# Patient Record
Sex: Male | Born: 1945
Health system: Southern US, Community
[De-identification: ages and names within clinical notes are randomized; demographics above are authoritative.]

## PROBLEM LIST (undated history)

## (undated) DIAGNOSIS — R519 Headache, unspecified: Secondary | ICD-10-CM

## (undated) DIAGNOSIS — F32A Depression, unspecified: Secondary | ICD-10-CM

## (undated) DIAGNOSIS — F329 Major depressive disorder, single episode, unspecified: Secondary | ICD-10-CM

## (undated) DIAGNOSIS — J189 Pneumonia, unspecified organism: Secondary | ICD-10-CM

## (undated) DIAGNOSIS — R569 Unspecified convulsions: Secondary | ICD-10-CM

## (undated) DIAGNOSIS — G039 Meningitis, unspecified: Secondary | ICD-10-CM

## (undated) DIAGNOSIS — R55 Syncope and collapse: Secondary | ICD-10-CM

## (undated) DIAGNOSIS — F5104 Psychophysiologic insomnia: Secondary | ICD-10-CM

## (undated) DIAGNOSIS — R51 Headache: Secondary | ICD-10-CM

## (undated) HISTORY — DX: Syncope and collapse: R55

## (undated) HISTORY — DX: Meningitis, unspecified: G03.9

## (undated) HISTORY — DX: Depression, unspecified: F32.A

## (undated) HISTORY — DX: Unspecified convulsions: R56.9

## (undated) HISTORY — DX: Psychophysiologic insomnia: F51.04

## (undated) HISTORY — DX: Major depressive disorder, single episode, unspecified: F32.9

## (undated) HISTORY — PX: OTHER SURGICAL HISTORY: SHX169

---

## 2005-02-09 ENCOUNTER — Ambulatory Visit (HOSPITAL_COMMUNITY): Admission: RE | Admit: 2005-02-09 | Discharge: 2005-02-09 | Payer: Self-pay | Admitting: Gastroenterology

## 2012-11-19 ENCOUNTER — Telehealth: Payer: Self-pay | Admitting: Neurology

## 2012-11-19 NOTE — Telephone Encounter (Signed)
I called and left a message for the patient that his medication list will be up front waiting for pick up.

## 2013-01-29 ENCOUNTER — Encounter: Payer: Self-pay | Admitting: Neurology

## 2013-01-29 DIAGNOSIS — R55 Syncope and collapse: Secondary | ICD-10-CM | POA: Insufficient documentation

## 2013-02-04 ENCOUNTER — Encounter: Payer: Self-pay | Admitting: Neurology

## 2013-02-04 ENCOUNTER — Ambulatory Visit (INDEPENDENT_AMBULATORY_CARE_PROVIDER_SITE_OTHER): Payer: Medicare Other | Admitting: Neurology

## 2013-02-04 VITALS — BP 112/71 | HR 67 | Wt 161.0 lb

## 2013-02-04 DIAGNOSIS — R55 Syncope and collapse: Secondary | ICD-10-CM

## 2013-02-04 MED ORDER — LEVETIRACETAM 750 MG PO TABS: 750.0000 mg | ORAL_TABLET | Freq: Two times a day (BID) | ORAL | Status: DC

## 2013-02-04 NOTE — Patient Instructions (Signed)
Try magnesium oxide tablets 250 mg at night

## 2013-02-04 NOTE — Progress Notes (Signed)
   Reason for visit: Possible seizure  Leon Kennedy is an 67 y.o. male  History of present illness:  Leon Kennedy is a 67 year old left-handed white male with a history of episodes of "lost time". The events are felt to represent seizures, and the patient has done better with the Keppra. The patient is on 750 mg twice daily, and he is no longer driving a car, he indicates that he has not driven in 6 or 7 years. The patient otherwise is functioning well, without any new medical problems. The patient does report some mild issues with memory which has been a problem for several years. The patient is remaining active, and he exercises on a regular basis, and plays golf.  Past Medical History  Diagnosis Date  . Seizures   . Depression   . Meningitis spinal     Past Surgical History  Procedure Laterality Date  . Tonsillectomy      Family History  Problem Relation Age of Onset  . Dementia Mother   . Depression Mother   . Heart attack Father   . Diabetes Father     Social history:  reports that he has never smoked. He has never used smokeless tobacco. He reports that  drinks alcohol. He reports that he does not use illicit drugs.  Allergies:  Allergies  Allergen Reactions  . Sulfa Antibiotics     Medications:  Current Outpatient Prescriptions on File Prior to Visit  Medication Sig Dispense Refill  . aspirin 81 MG tablet Take 81 mg by mouth daily.      . Coenzyme Q10 (CO Q 10) 10 MG CAPS Take 100 tablets by mouth daily.      Marland Kitchen loratadine (CLARITIN) 10 MG tablet Take 10 mg by mouth daily.      . Multiple Vitamin (MULTIVITAMIN) tablet Take 1 tablet by mouth daily.       No current facility-administered medications on file prior to visit.    ROS:  Out of a complete 14 system review of symptoms, the patient complains only of the following symptoms, and all other reviewed systems are negative.  Weight loss Blurred vision Easy bruising Increased thirst Urination  frequency Runny nose Memory loss, insomnia Nocturnal leg cramps  Blood pressure 112/71, pulse 67, weight 161 lb (73.029 kg).  Physical Exam  General: The patient is alert and cooperative at the time of the examination.  Skin: No significant peripheral edema is noted.   Neurologic Exam  Cranial nerves: Facial symmetry is present. Speech is normal, no aphasia or dysarthria is noted. Extraocular movements are full. Visual fields are full.  Motor: The patient has good strength in all 4 extremities.  Coordination: The patient has good finger-nose-finger and heel-to-shin bilaterally.  Gait and station: The patient has a normal gait. Tandem gait is normal. Romberg is negative. No drift is seen.  Reflexes: Deep tendon reflexes are symmetric.   Assessment/Plan:  1. Probable seizure events  2. Nocturnal leg cramps  The patient will begin taking magnesium oxide tablets at night on the nights that he plays golf during the day. The patient will remain on Keppra, and he will followup in one year. The patient doing quite well on the medication.  Marlan Palau MD 02/04/2013 7:44 PM  [facility] [address] [place], [place] [address]  Phone [phone] Fax [phone]

## 2013-02-05 ENCOUNTER — Encounter: Payer: Self-pay | Admitting: Neurology

## 2013-04-25 ENCOUNTER — Other Ambulatory Visit: Payer: Self-pay

## 2014-01-14 ENCOUNTER — Other Ambulatory Visit: Payer: Self-pay

## 2014-01-14 MED ORDER — LEVETIRACETAM 750 MG PO TABS: 750.0000 mg | ORAL_TABLET | Freq: Two times a day (BID) | ORAL | Status: DC

## 2014-01-14 NOTE — Telephone Encounter (Signed)
Patient has appt scheduled in Nov  

## 2014-02-03 ENCOUNTER — Ambulatory Visit: Payer: Medicare Other | Admitting: Neurology

## 2014-03-27 ENCOUNTER — Ambulatory Visit: Payer: Medicare Other | Admitting: Neurology

## 2014-03-31 ENCOUNTER — Ambulatory Visit
Admission: RE | Admit: 2014-03-31 | Discharge: 2014-03-31 | Disposition: A | Discharge status: Home / Self Care | Payer: Medicare Other | Source: Ambulatory Visit | Attending: Nurse Practitioner | Admitting: Nurse Practitioner

## 2014-03-31 ENCOUNTER — Other Ambulatory Visit: Payer: Self-pay | Admitting: Nurse Practitioner

## 2014-03-31 DIAGNOSIS — R059 Cough, unspecified: Secondary | ICD-10-CM

## 2014-03-31 DIAGNOSIS — R05 Cough: Secondary | ICD-10-CM

## 2014-03-31 DIAGNOSIS — J189 Pneumonia, unspecified organism: Secondary | ICD-10-CM

## 2014-03-31 HISTORY — DX: Pneumonia, unspecified organism: J18.9

## 2014-05-05 ENCOUNTER — Encounter: Payer: Self-pay | Admitting: Neurology

## 2014-05-05 ENCOUNTER — Ambulatory Visit (INDEPENDENT_AMBULATORY_CARE_PROVIDER_SITE_OTHER): Payer: Medicare Other | Admitting: Neurology

## 2014-05-05 VITALS — BP 122/80 | HR 80 | Ht 68.0 in | Wt 164.4 lb

## 2014-05-05 DIAGNOSIS — R55 Syncope and collapse: Secondary | ICD-10-CM

## 2014-05-05 HISTORY — DX: Syncope and collapse: R55

## 2014-05-05 MED ORDER — LEVETIRACETAM 750 MG PO TABS: 750.0000 mg | ORAL_TABLET | Freq: Two times a day (BID) | ORAL | Status: DC

## 2014-05-05 MED ORDER — MELATONIN 10 MG PO TABS: 10.0000 mg | ORAL_TABLET | Freq: Every day | ORAL | Status: DC

## 2014-05-05 NOTE — Progress Notes (Signed)
Reason for visit: Possible seizures  Leon Kennedy is an 68 y.o. male  History of present illness:  [name] is a 68 year old left-handed white male with a history of episodes of "spaciness" that are felt to be possible seizure events. The patient is on Keppra. The patient has very occasional events at this time, possibly 1 or 2 since he was seen over a year ago. The patient has recently been treated for an episode of pneumonia. Otherwise, he is doing relatively well. He has chronic insomnia, he is taking Benadryl and melatonin at night. He drinks a lot of water during the day, and he has frequency of urination associated with this. He occasionally will have some muscle cramps in the calf muscles. He returns to this office for an evaluation.   Past Medical History  Diagnosis Date  . Seizures   . Depression   . Meningitis spinal   . Near syncope 05/05/2014    Past Surgical History  Procedure Laterality Date  . Tonsillectomy      Family History  Problem Relation Age of Onset  . Dementia Mother   . Depression Mother   . Heart attack Father   . Diabetes Father     Social history:  reports that he has never smoked. He has never used smokeless tobacco. He reports that he drinks alcohol. He reports that he does not use illicit drugs.    Allergies  Allergen Reactions  . Sulfa Antibiotics     Medications:  Current Outpatient Prescriptions on File Prior to Visit  Medication Sig Dispense Refill  . aspirin 81 MG tablet Take 81 mg by mouth daily.    . Coenzyme Q10 (CO Q 10) 10 MG CAPS Take 100 tablets by mouth daily.    Marland Kitchen. docusate sodium (COLACE) 100 MG capsule Take 100 mg by mouth 2 (two) times daily.    . Multiple Vitamin (MULTIVITAMIN) tablet Take 1 tablet by mouth daily.    . Red Yeast Rice 600 MG CAPS Take 600 capsules by mouth daily.    . diphenhydrAMINE (BENADRYL) 50 MG capsule Take 50 mg by mouth daily.     No current facility-administered medications on file  prior to visit.    ROS:  Out of a complete 14 system review of symptoms, the patient complains only of the following symptoms, and all other reviewed systems are negative.  Runny nose Choking on food Insomnia, snoring Frequency of urination, urinary urgency Muscle cramps Bleeding easily Memory loss, seizures Agitation  Blood pressure 122/80, pulse 80, height 5\' 8"  (1.727 m), weight 164 lb 6.4 oz (74.571 kg).  Physical Exam  General: The patient is alert and cooperative at the time of the examination.  Skin: No significant peripheral edema is noted.   Neurologic Exam  Mental status: The patient is oriented x 3.  Cranial nerves: Facial symmetry is present. Speech is normal, no aphasia or dysarthria is noted. Extraocular movements are full. Visual fields are full.  Motor: The patient has good strength in all 4 extremities.  Sensory examination: Soft touch sensation is symmetric on the face, arms, and legs.  Coordination: The patient has good finger-nose-finger and heel-to-shin bilaterally.  Gait and station: The patient has a normal gait. Tandem gait is normal. Romberg is negative. No drift is seen.  Reflexes: Deep tendon reflexes are symmetric.   Assessment/Plan:  1. Possible seizure events  The patient doing relatively well since last seen, he will follow-up in about one year. We  will renew the Keppra this time. He will call if issues arise.  Marlan Palau. Keith Willis MD 05/05/2014 7:32 PM  [facility] [address] [place], [place] [id number]  Phone [phone] Fax [phone]

## 2015-03-04 ENCOUNTER — Other Ambulatory Visit: Payer: Self-pay | Admitting: Gastroenterology

## 2015-03-05 ENCOUNTER — Encounter (HOSPITAL_COMMUNITY): Payer: Self-pay | Admitting: *Deleted

## 2015-03-09 ENCOUNTER — Encounter (HOSPITAL_COMMUNITY): Payer: Self-pay | Admitting: *Deleted

## 2015-03-09 ENCOUNTER — Ambulatory Visit (HOSPITAL_COMMUNITY): Payer: Medicare Other | Admitting: Certified Registered Nurse Anesthetist

## 2015-03-09 ENCOUNTER — Ambulatory Visit (HOSPITAL_COMMUNITY)
Admission: RE | Admit: 2015-03-09 | Discharge: 2015-03-09 | Disposition: A | Discharge status: Home / Self Care | Payer: Medicare Other | Source: Ambulatory Visit | Attending: Gastroenterology | Admitting: Gastroenterology

## 2015-03-09 ENCOUNTER — Encounter (HOSPITAL_COMMUNITY)
Admission: RE | Disposition: A | Discharge status: Home / Self Care | Payer: Self-pay | Source: Ambulatory Visit | Attending: Gastroenterology

## 2015-03-09 DIAGNOSIS — Z87891 Personal history of nicotine dependence: Secondary | ICD-10-CM | POA: Insufficient documentation

## 2015-03-09 DIAGNOSIS — Z1211 Encounter for screening for malignant neoplasm of colon: Secondary | ICD-10-CM | POA: Diagnosis not present

## 2015-03-09 DIAGNOSIS — N400 Enlarged prostate without lower urinary tract symptoms: Secondary | ICD-10-CM | POA: Insufficient documentation

## 2015-03-09 DIAGNOSIS — E780 Pure hypercholesterolemia: Secondary | ICD-10-CM | POA: Diagnosis not present

## 2015-03-09 DIAGNOSIS — G40909 Epilepsy, unspecified, not intractable, without status epilepticus: Secondary | ICD-10-CM | POA: Insufficient documentation

## 2015-03-09 HISTORY — DX: Headache, unspecified: R51.9

## 2015-03-09 HISTORY — PX: COLONOSCOPY WITH PROPOFOL: SHX5780

## 2015-03-09 HISTORY — DX: Pneumonia, unspecified organism: J18.9

## 2015-03-09 HISTORY — DX: Headache: R51

## 2015-03-09 SURGERY — COLONOSCOPY WITH PROPOFOL
Anesthesia: Monitor Anesthesia Care

## 2015-03-09 MED ORDER — LACTATED RINGERS IV SOLN: INTRAVENOUS | Status: DC

## 2015-03-09 MED ORDER — PROPOFOL 10 MG/ML IV BOLUS
INTRAVENOUS | Status: DC | PRN
  Administered 2015-03-09: 20 mg via INTRAVENOUS
  Administered 2015-03-09: 30 mg via INTRAVENOUS
  Administered 2015-03-09: 100 mg via INTRAVENOUS
  Administered 2015-03-09: 30 mg via INTRAVENOUS
  Administered 2015-03-09: 20 mg via INTRAVENOUS

## 2015-03-09 MED ORDER — PROPOFOL 10 MG/ML IV BOLUS
INTRAVENOUS | Status: AC
  Filled 2015-03-09: qty 20

## 2015-03-09 MED ORDER — FENTANYL CITRATE (PF) 100 MCG/2ML IJ SOLN: 25.0000 ug | INTRAMUSCULAR | Status: DC | PRN

## 2015-03-09 MED ORDER — LACTATED RINGERS IV SOLN
INTRAVENOUS | Status: DC
  Administered 2015-03-09: 1000 mL via INTRAVENOUS

## 2015-03-09 MED ORDER — SODIUM CHLORIDE 0.9 % IV SOLN: INTRAVENOUS | Status: DC

## 2015-03-09 MED ORDER — GLYCOPYRROLATE 0.2 MG/ML IJ SOLN
INTRAMUSCULAR | Status: AC
  Filled 2015-03-09: qty 1

## 2015-03-09 SURGICAL SUPPLY — 21 items

## 2015-03-09 NOTE — Anesthesia Postprocedure Evaluation (Signed)
  Anesthesia Post-op Note  Patient: Leon Kennedy  Procedure(s) Performed: Procedure(s) (LRB): COLONOSCOPY WITH PROPOFOL (N/A)  Patient Location: PACU  Anesthesia Type: MAC  Level of Consciousness: awake and alert   Airway and Oxygen Therapy: Patient Spontanous Breathing  Post-op Pain: mild  Post-op Assessment: Post-op Vital signs reviewed, Patient's Cardiovascular Status Stable, Respiratory Function Stable, Patent Airway and No signs of Nausea or vomiting  Last Vitals:  Filed Vitals:   03/09/15 1209  BP: 136/73  Pulse: 76  Temp:   Resp: 16    Post-op Vital Signs: stable   Complications: No apparent anesthesia complications

## 2015-03-09 NOTE — Transfer of Care (Signed)
Immediate Anesthesia Transfer of Care Note  Patient: Leon Kennedy [facility]  Procedure(s) Performed: Procedure(s): COLONOSCOPY WITH PROPOFOL (N/A)  Patient Location: PACU and Endoscopy Unit  Anesthesia Type:MAC  Level of Consciousness: awake, alert , oriented and patient cooperative  Airway & Oxygen Therapy: Patient Spontanous Breathing and Patient connected to face mask oxygen  Post-op Assessment: Report given to RN and Post -op Vital signs reviewed and stable  Post vital signs: Reviewed and stable  Last Vitals:  Filed Vitals:   03/09/15 1049  BP: 126/72  Pulse: 73  Temp: 37 C  Resp: 10    Complications: No apparent anesthesia complications

## 2015-03-09 NOTE — H&P (Signed)
  Procedure: Screening colonoscopy. Normal screening colonoscopy performed on 02/09/2005  History: The patient is a 69 year old male born 07-28-1945. He is scheduled to undergo a screening colonoscopy today.  Past medical history: Tonsillectomy. Seizure disorder. Benign prostatic hypertrophy. Spinal meningitis diagnosed at age 49. Herpes zoster left buttocks diagnosed in 2010. Nephrolithiasis. Plantar warts. Left shoulder tendinitis. Hypercholesterolemia.  Medication allergies: Sulfa drugs  Exam: Patient is alert and lying comfortably on endoscopy stretcher. Abdomen is soft and nontender to palpation. Lungs are clear to auscultation. Cardiac exam reveals a regular rhythm.  Plan: Proceed with screening colonoscopy

## 2015-03-09 NOTE — Op Note (Signed)
Procedure: Screening colonoscopy. Normal screening colonoscopy performed on 02/09/2005  Endoscopist: Danise Edge  Premedication: Propofol administered by anesthesia  Procedure: The patient was placed in the left lateral decubitus position. Anal inspection and digital rectal exam were normal. The Pentax pediatric colonoscope was introduced into the rectum and advanced to the cecum. A normal-appearing ileocecal valve and appendiceal orifice were identified. Colonic preparation for the exam today was good. Withdrawal time was 7 minutes  Rectum. Normal. Retroflex view of the distal rectum was normal  Sigmoid colon and descending colon. Normal  Splenic flexure. Normal  Transverse colon. Normal  Hepatic flexure. Normal  Ascending colon. Normal  Cecum and ileocecal valve. Normal.  Assessment: Normal screening colonoscopy

## 2015-03-09 NOTE — Discharge Instructions (Signed)
Colonoscopy, Care After °These instructions give you information on caring for yourself after your procedure. Your doctor may also give you more specific instructions. Call your doctor if you have any problems or questions after your procedure. °HOME CARE °· Do not drive for 24 hours. °· Do not sign important papers or use machinery for 24 hours. °· You may shower. °· You may go back to your usual activities, but go slower for the first 24 hours. °· Take rest breaks often during the first 24 hours. °· Walk around or use warm packs on your belly (abdomen) if you have belly cramping or gas. °· Drink enough fluids to keep your pee (urine) clear or pale yellow. °· Resume your normal diet. Avoid heavy or fried foods. °· Avoid drinking alcohol for 24 hours or as told by your doctor. °· Only take medicines as told by your doctor. °If a tissue sample (biopsy) was taken during the procedure:  °· Do not take aspirin or blood thinners for 7 days, or as told by your doctor. °· Do not drink alcohol for 7 days, or as told by your doctor. °· Eat soft foods for the first 24 hours. °GET HELP IF: °You still have a small amount of blood in your poop (stool) 2-3 days after the procedure. °GET HELP RIGHT AWAY IF: °· You have more than a small amount of blood in your poop. °· You see clumps of tissue (blood clots) in your poop. °· Your belly is puffy (swollen). °· You feel sick to your stomach (nauseous) or throw up (vomit). °· You have a fever. °· You have belly pain that gets worse and medicine does not help. °MAKE SURE YOU: °· Understand these instructions. °· Will watch your condition. °· Will get help right away if you are not doing well or get worse. °Document Released: 07/09/2010 Document Revised: 06/11/2013 Document Reviewed: 02/11/2013 °ExitCare® Patient Information ©2015 [employer]. This information is not intended to replace advice given to you by your health care provider. Make sure you discuss any questions you have with  your health care provider. ° °

## 2015-03-09 NOTE — Anesthesia Preprocedure Evaluation (Addendum)
Anesthesia Evaluation  Patient identified by MRN, date of birth, ID band Patient awake    Reviewed: Allergy & Precautions, H&P , NPO status , Patient's Chart, lab work & pertinent test results  Airway Mallampati: II  TM Distance: >3 FB Neck ROM: full    Dental  (+) Dental Advisory Given, Caps All front upper are capped:   Pulmonary neg pulmonary ROS, former smoker,    Pulmonary exam normal breath sounds clear to auscultation       Cardiovascular Exercise Tolerance: Good negative cardio ROS Normal cardiovascular exam Rhythm:regular Rate:Normal     Neuro/Psych  Headaches, Seizures -, Well Controlled,  Last seizure 4 - 5 years ago negative psych ROS   GI/Hepatic negative GI ROS, Neg liver ROS,   Endo/Other  negative endocrine ROS  Renal/GU negative Renal ROS  negative genitourinary   Musculoskeletal   Abdominal   Peds  Hematology negative hematology ROS (+)   Anesthesia Other Findings   Reproductive/Obstetrics negative OB ROS                            Anesthesia Physical Anesthesia Plan  ASA: II  Anesthesia Plan: MAC   Post-op Pain Management:    Induction:   Airway Management Planned:   Additional Equipment:   Intra-op Plan:   Post-operative Plan:   Informed Consent: I have reviewed the patients History and Physical, chart, labs and discussed the procedure including the risks, benefits and alternatives for the proposed anesthesia with the patient or authorized representative who has indicated his/her understanding and acceptance.   Dental Advisory Given  Plan Discussed with: CRNA and Surgeon  Anesthesia Plan Comments:         Anesthesia Quick Evaluation

## 2015-03-10 ENCOUNTER — Encounter (HOSPITAL_COMMUNITY): Payer: Self-pay | Admitting: Gastroenterology

## 2015-05-04 ENCOUNTER — Ambulatory Visit (INDEPENDENT_AMBULATORY_CARE_PROVIDER_SITE_OTHER): Payer: Medicare Other | Admitting: Neurology

## 2015-05-04 ENCOUNTER — Encounter: Payer: Self-pay | Admitting: Neurology

## 2015-05-04 VITALS — BP 113/70 | HR 69 | Ht 68.0 in | Wt 167.5 lb

## 2015-05-04 DIAGNOSIS — R55 Syncope and collapse: Secondary | ICD-10-CM | POA: Diagnosis not present

## 2015-05-04 MED ORDER — LEVETIRACETAM 750 MG PO TABS: 750.0000 mg | ORAL_TABLET | Freq: Two times a day (BID) | ORAL | Status: DC

## 2015-05-04 NOTE — Progress Notes (Signed)
Reason for visit: Altered awareness  Leon Kennedy is an 69 y.o. male  History of present illness:  [name] is a 69 year old left-handed white male with a history of episodes of transient loss of awareness that have been occurring with some regularity. He is probably had 15-20 episodes over the last year. The patient will have a brief staring episode where he has trouble concentrating. The episodes only last a few moments, and then fully resolve. The patient does not operate a motor vehicle, he has not driven in greater than 8 years. The patient remains active, playing golf on a regular basis. He has had pneumonia in March 2016, he is fully recovered from this. He is tolerating the Keppra fairly well, sleeping well at night. He returns for an evaluation.  Past Medical History  Diagnosis Date  . Meningitis spinal age 70 months  . Near syncope 05/05/2014    fainting spells, none in years  . Depression age mid 6640's and early 5960's  . Headache     sinus  . Seizures (HCC) last 4  to 5 years ago  . Pneumonia 03-31-14    lungs clear since    Past Surgical History  Procedure Laterality Date  . Tonsillectomy  age 89 or 6  . Colonoscopy with propofol N/A 03/09/2015    Procedure: COLONOSCOPY WITH PROPOFOL;  Surgeon: Charolett BumpersMartin K Johnson, MD;  Location: [facility] END[facility]COPY;  Service: Endoscopy;  Laterality: N/A;    Family History  Problem Relation Age of Onset  . Dementia Mother   . Depression Mother   . Heart attack Father   . Diabetes Father     Social history:  reports that he has quit smoking. His smoking use included Cigarettes. He has a 20 pack-year smoking history. He has never used smokeless tobacco. He reports that he drinks alcohol. He reports that he does not use illicit drugs.    Allergies  Allergen Reactions  . Sulfa Antibiotics Anaphylaxis    Childhood reaction    Medications:  Prior to Admission medications   Medication Sig Start Date End Date Taking? Authorizing  Provider  aspirin 81 MG tablet Take 81 mg by mouth 3 (three) times a week.    Yes Historical Provider, MD  Coenzyme Q10 (CO Q 10) 10 MG CAPS Take 100 mg by mouth daily.    Yes Historical Provider, MD  diphenhydrAMINE (BENADRYL) 25 MG tablet Take 25 mg by mouth at bedtime.   Yes Historical Provider, MD  docusate sodium (COLACE) 100 MG capsule Take 100 mg by mouth daily.    Yes Historical Provider, MD  Echinacea 400 MG CAPS Take 500 mg by mouth daily as needed ("feeling sick").    Yes Historical Provider, MD  levETIRAcetam (KEPPRA) 750 MG tablet Take 1 tablet (750 mg total) by mouth every 12 (twelve) hours. 05/05/14  Yes York Spanielharles K Wilhelm Ganaway, MD  Melatonin 5 MG CAPS Take 5 mg by mouth at bedtime.   Yes Historical Provider, MD  Multiple Vitamin (MULTIVITAMIN) tablet Take 1 tablet by mouth daily.   Yes Historical Provider, MD    ROS:  Out of a complete 14 system review of symptoms, the patient complains only of the following symptoms, and all other reviewed systems are negative.  Transient confusion episodes  Blood pressure 113/70, pulse 69, height 5\' 8"  (1.727 m), weight 167 lb 8 oz (75.978 kg).  Physical Exam  General: The patient is alert and cooperative at the time of the examination.  Skin: No significant peripheral edema is noted.   Neurologic Exam  Mental status: The patient is alert and oriented x 3 at the time of the examination. The patient has apparent normal recent and remote memory, with an apparently normal attention span and concentration ability.   Cranial nerves: Facial symmetry is present. Speech is normal, no aphasia or dysarthria is noted. Extraocular movements are full. Visual fields are full.  Motor: The patient has good strength in all 4 extremities.  Sensory examination: Soft touch sensation is symmetric on the face, arms, and legs.  Coordination: The patient has good finger-nose-finger and heel-to-shin bilaterally.  Gait and station: The patient has a normal  gait. Tandem gait is normal. Romberg is negative. No drift is seen.  Reflexes: Deep tendon reflexes are symmetric.   Assessment/Plan:  1. Transient altered awareness  The patient is felt to have brief seizure-type episodes, the Keppra has not fully controlled these events. The patient does not operate a motor vehicle. He will follow-up in one year, a prescription was given for the Keppra.  Marlan Palau MD 05/04/2015 7:54 PM  [facility] [address] [place], [place] [address]  Phone [phone] Fax [phone]

## 2015-07-24 ENCOUNTER — Telehealth: Payer: Self-pay | Admitting: Neurology

## 2015-07-24 MED ORDER — LEVETIRACETAM 750 MG PO TABS: 750.0000 mg | ORAL_TABLET | Freq: Two times a day (BID) | ORAL | Status: DC

## 2015-07-24 NOTE — Telephone Encounter (Signed)
Patient called to advise as of 07/22/15 prescriptions will go to [facility] vs. [facility], Rx for levETIRAcetam (KEPPRA) 750 MG tablet needs to be sent to [facility] Fax# [phone].

## 2015-08-10 DIAGNOSIS — E7800 Pure hypercholesterolemia, unspecified: Secondary | ICD-10-CM | POA: Diagnosis not present

## 2015-08-10 DIAGNOSIS — Z1389 Encounter for screening for other disorder: Secondary | ICD-10-CM | POA: Diagnosis not present

## 2015-08-10 DIAGNOSIS — Z0000 Encounter for general adult medical examination without abnormal findings: Secondary | ICD-10-CM | POA: Diagnosis not present

## 2015-08-19 IMAGING — CR DG CHEST 2V
2 series · 2 of 2 positions shown · non-contrast
Comparison: None.

CLINICAL DATA: Cough for 1 week and fever ; congestion

EXAM:
CHEST  2 VIEW

[w chest pa]
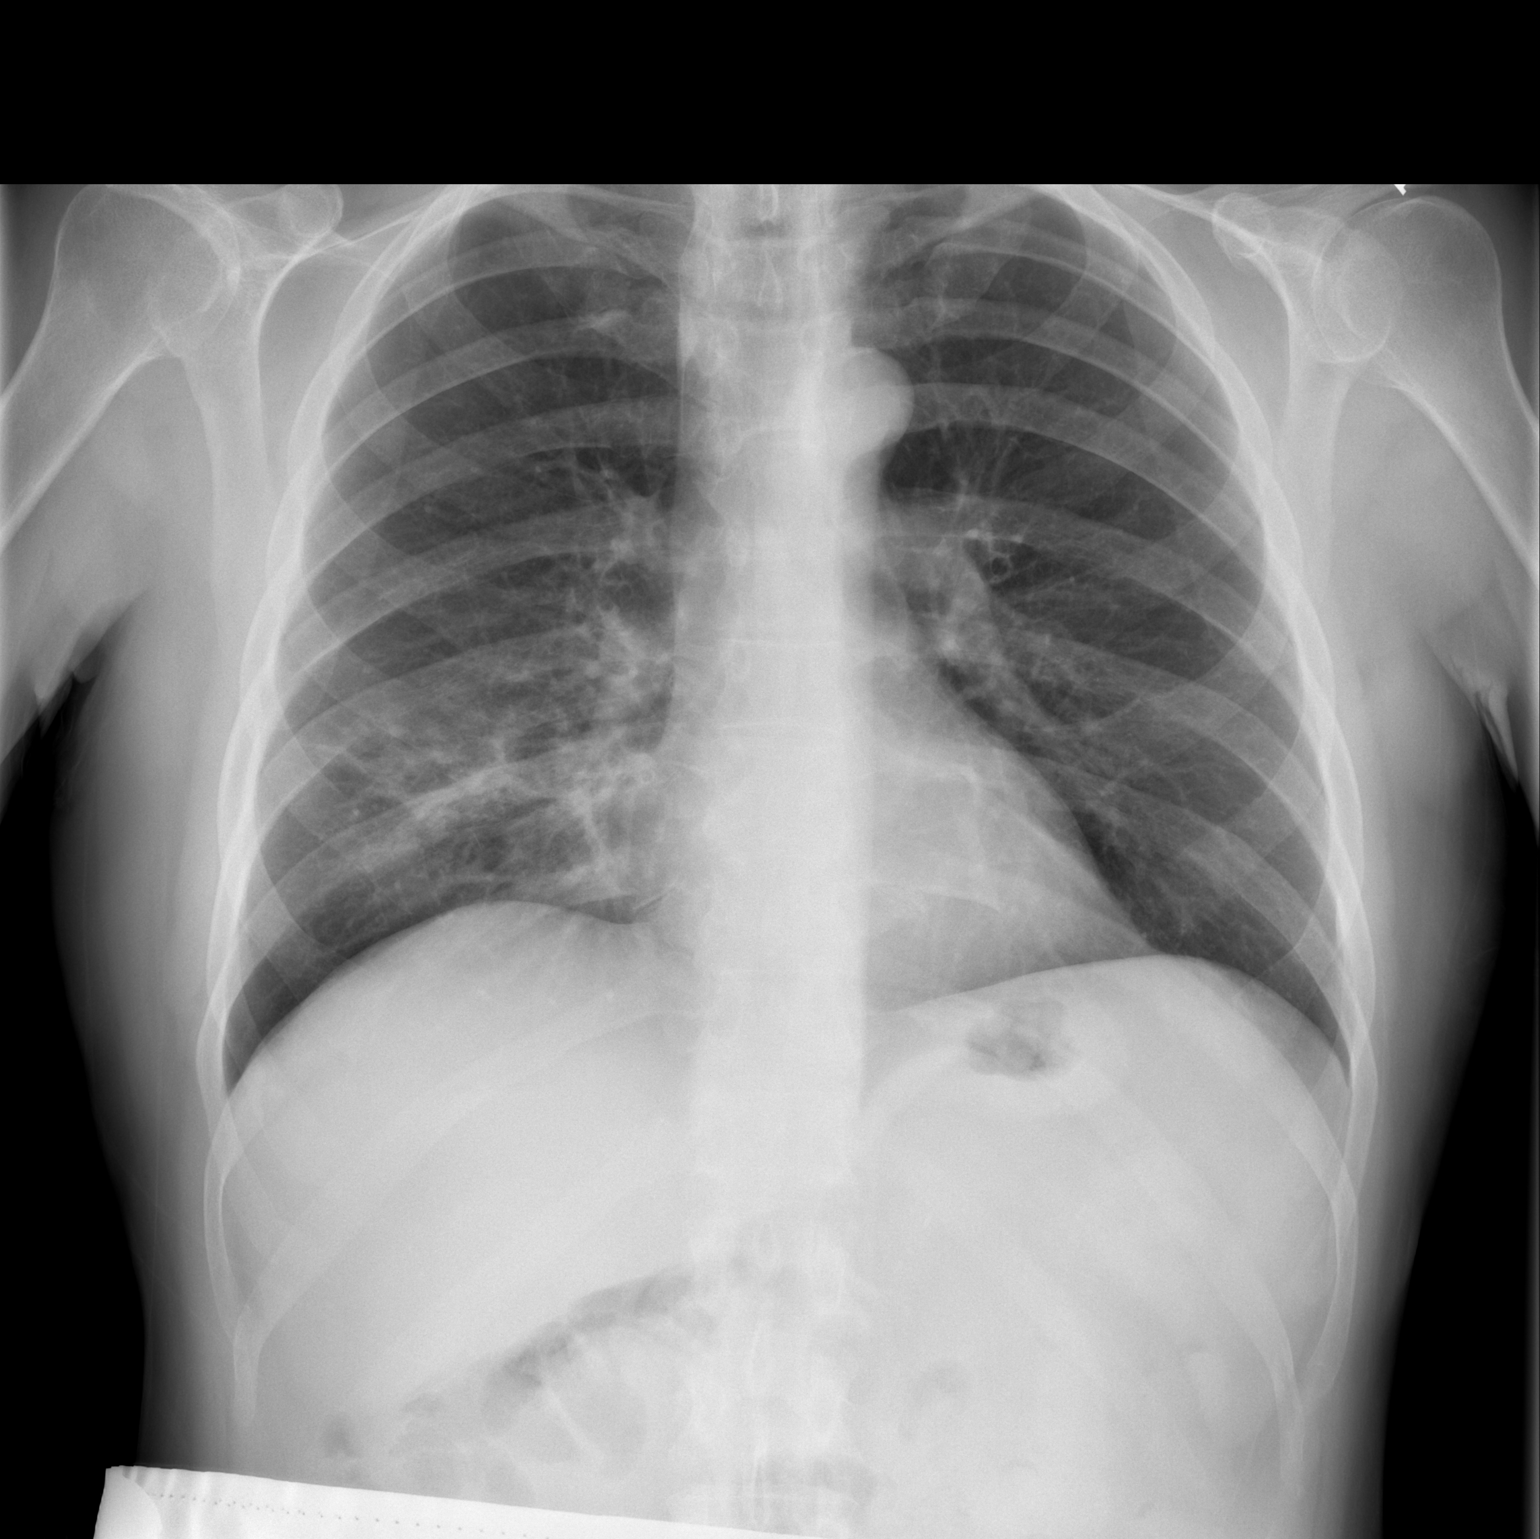

[w chest lat]
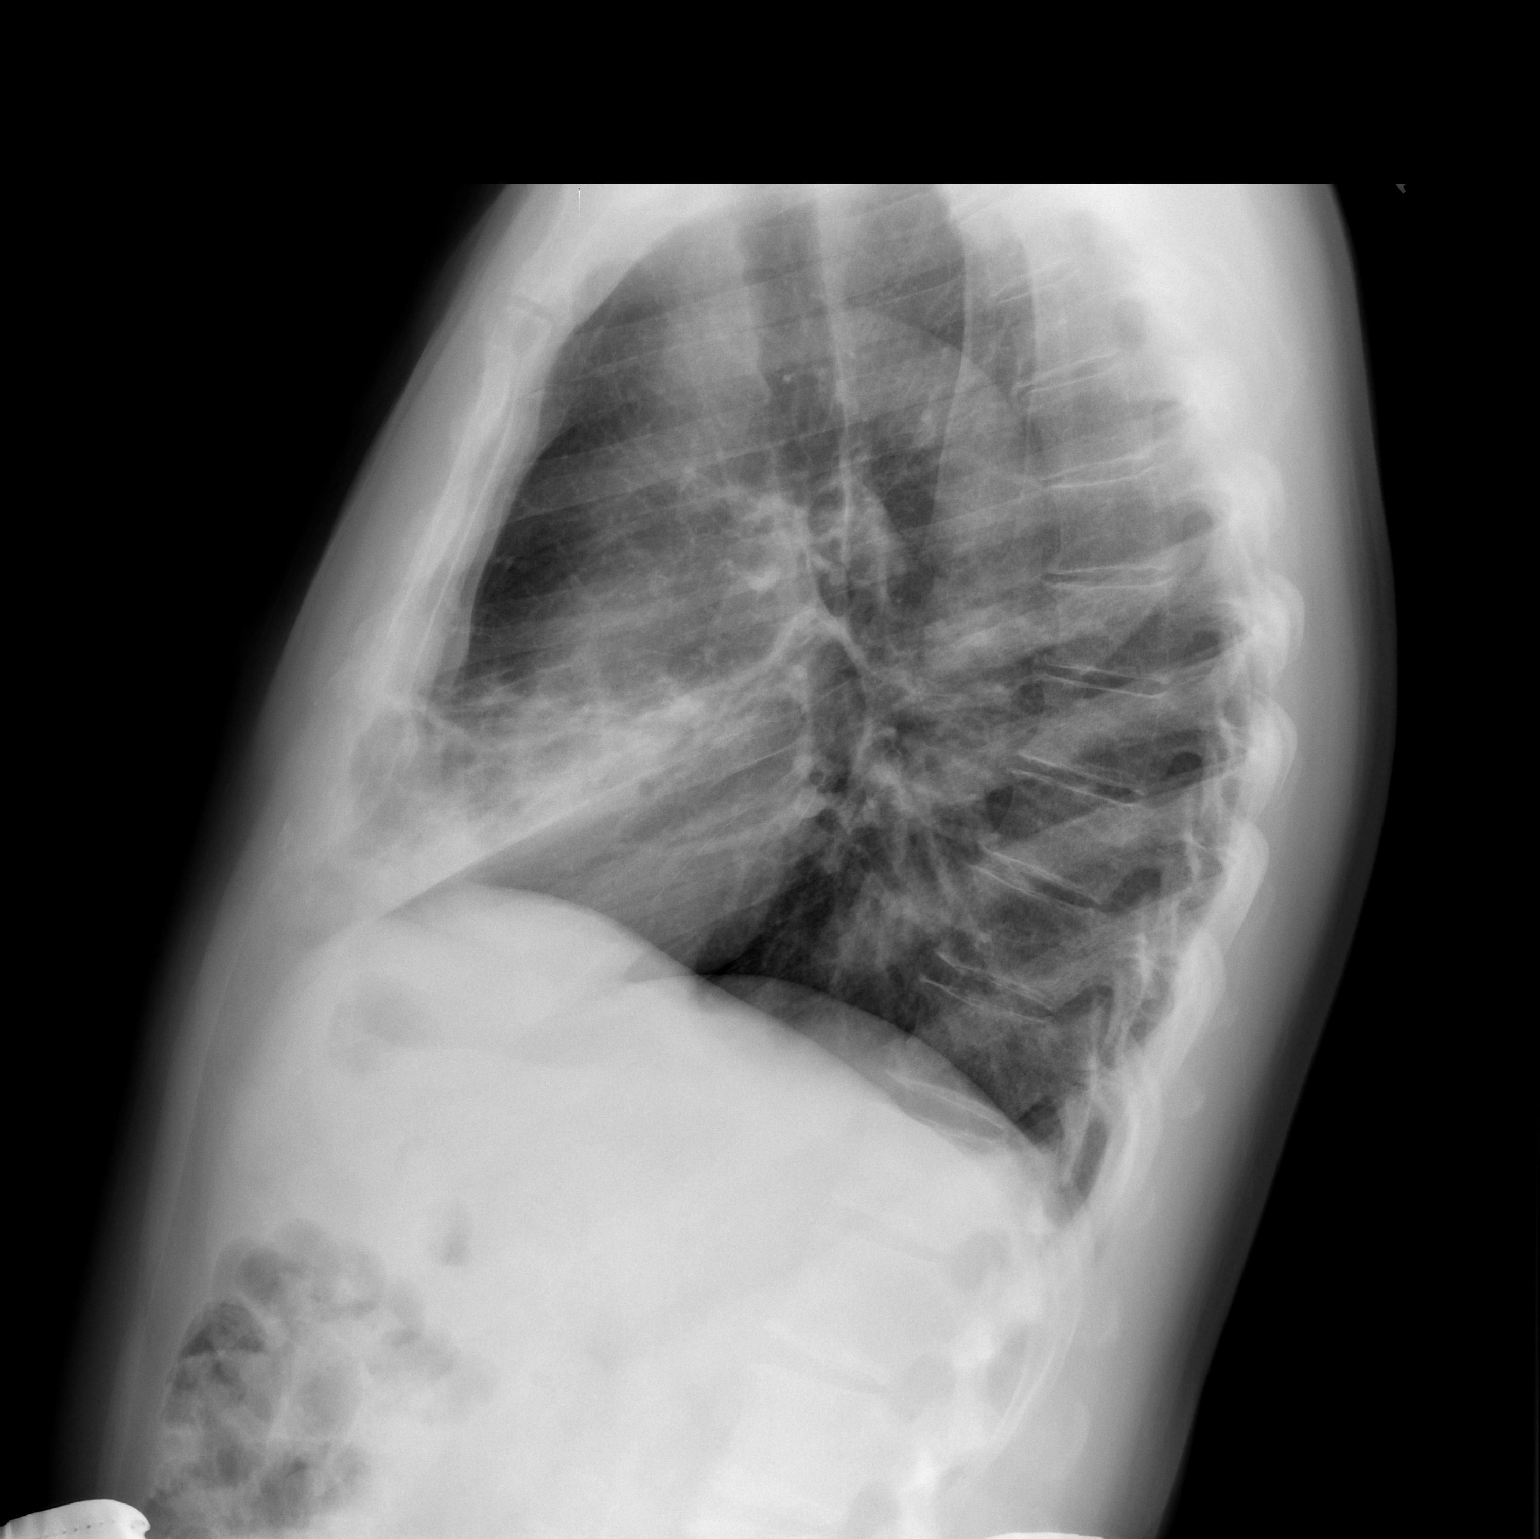

[2 of 2 positions shown; findings below may reference images not displayed]

FINDINGS: There is airspace consolidation in the right middle lobe with volume
loss. Elsewhere lungs are clear. Heart size and pulmonary
vascularity are normal. No adenopathy. No bone lesions.
IMPRESSION: Right middle lobe infiltrate/consolidation. Elsewhere lungs are
clear.

These results will be called to the ordering clinician or
representative by the Radiologist Assistant, and communication
documented in the PACS or zVision Dashboard.

## 2016-04-28 ENCOUNTER — Telehealth: Payer: Self-pay | Admitting: Neurology

## 2016-04-28 MED ORDER — LEVETIRACETAM 750 MG PO TABS: 750.0000 mg | ORAL_TABLET | Freq: Two times a day (BID) | ORAL | 3 refills | Status: DC

## 2016-04-28 NOTE — Telephone Encounter (Signed)
Refills e-scribed as requested. 

## 2016-04-28 NOTE — Telephone Encounter (Signed)
Patient called to request refill of levETIRAcetam (KEPPRA) 750 MG tablet to [facility] [phone].

## 2016-05-02 ENCOUNTER — Ambulatory Visit (INDEPENDENT_AMBULATORY_CARE_PROVIDER_SITE_OTHER): Payer: Medicare Other | Admitting: Neurology

## 2016-05-02 ENCOUNTER — Encounter: Payer: Self-pay | Admitting: Neurology

## 2016-05-02 VITALS — BP 125/79 | HR 65 | Wt 168.0 lb

## 2016-05-02 DIAGNOSIS — R55 Syncope and collapse: Secondary | ICD-10-CM

## 2016-05-02 NOTE — Progress Notes (Signed)
Reason for visit: Transient alteration of awareness  Samul DadaJesse J Jr Zubiate is an 70 y.o. male  History of present illness:  Mr. Volney PresserMcMurry is a 70 year old left-handed white male with a history of episodes of transient alteration of awareness. The patient does not operate a motor vehicle, he has not driven in 8 or 9 years. The patient is on Keppra taking 750 mg twice daily. He reports that he is tolerating this very well, but he is slightly irritable at times. The patient indicates that this is not a big issue for him. He has been medically stable since last seen, his wife is indicated that he has had very few episodes over the last year. He returns for an evaluation.  Past Medical History:  Diagnosis Date  . Depression age mid 4440's and early 3660's  . Headache    sinus  . Meningitis spinal age 71 months  . Near syncope 05/05/2014   fainting spells, none in years  . Pneumonia 2015, 2017  . Seizures (HCC) last 4  to 5 years ago    Past Surgical History:  Procedure Laterality Date  . COLONOSCOPY WITH PROPOFOL N/A 03/09/2015   Procedure: COLONOSCOPY WITH PROPOFOL;  Surgeon: Charolett BumpersMartin K Johnson, MD;  Location: [facility];  Service: Endoscopy;  Laterality: N/A;  . TONSILLECTOMY  age 89 or 89    Family History  Problem Relation Age of Onset  . Dementia Mother   . Depression Mother   . Heart attack Father   . Diabetes Father     Social history:  reports that he has quit smoking. His smoking use included Cigarettes. He has a 20.00 pack-year smoking history. He has never used smokeless tobacco. He reports that he drinks alcohol. He reports that he does not use drugs.    Allergies  Allergen Reactions  . Sulfa Antibiotics Anaphylaxis    Childhood reaction    Medications:  Prior to Admission medications   Medication Sig Start Date End Date Taking? Authorizing Provider  Ascorbic Acid (VITAMIN C) 1000 MG tablet Take 1,000 mg by mouth daily.   Yes Historical Provider, MD  aspirin 81 MG  tablet Take 81 mg by mouth 3 (three) times a week.    Yes Historical Provider, MD  Lactobacillus (PROBIOTIC ACIDOPHILUS PO) Take by mouth.   Yes Historical Provider, MD  levETIRAcetam (KEPPRA) 750 MG tablet Take 1 tablet (750 mg total) by mouth every 12 (twelve) hours. 04/28/16  Yes York Spanielharles K Shermika Balthaser, MD  MAGNESIUM GLYCINATE PLUS PO Take 400 mg by mouth daily.   Yes Historical Provider, MD  Melatonin 5 MG CAPS Take 4 mg by mouth at bedtime.    Yes Historical Provider, MD  Multiple Vitamin (MULTIVITAMIN) tablet Take 1 tablet by mouth daily.   Yes Historical Provider, MD  Coenzyme Q10 (CO Q 10) 10 MG CAPS Take 100 mg by mouth daily.     Historical Provider, MD  diphenhydrAMINE (BENADRYL) 25 MG tablet Take 25 mg by mouth at bedtime.    Historical Provider, MD  docusate sodium (COLACE) 100 MG capsule Take 100 mg by mouth daily.     Historical Provider, MD  Echinacea 400 MG CAPS Take 500 mg by mouth daily as needed ("feeling sick").     Historical Provider, MD    ROS:  Out of a complete 14 system review of symptoms, the patient complains only of the following symptoms, and all other reviewed systems are negative.  Runny nose Cold intolerance Insomnia Frequency of urination,  urinary urgency Bruising easily Memory loss Agitation  Blood pressure 125/79, pulse 65, weight 168 lb (76.2 kg).  Physical Exam  General: The patient is alert and cooperative at the time of the examination.  Skin: No significant peripheral edema is noted.   Neurologic Exam  Mental status: The patient is alert and oriented x 3 at the time of the examination. The patient has apparent normal recent and remote memory, with an apparently normal attention span and concentration ability.   Cranial nerves: Facial symmetry is present. Speech is normal, no aphasia or dysarthria is noted. Extraocular movements are full. Visual fields are full.  Motor: The patient has good strength in all 4 extremities.  Sensory  examination: Soft touch sensation is symmetric on the face, arms, and legs.  Coordination: The patient has good finger-nose-finger and heel-to-shin bilaterally.  Gait and station: The patient has a normal gait. Tandem gait is normal. Romberg is negative. No drift is seen.  Reflexes: Deep tendon reflexes are symmetric.   Assessment/Plan:  1. Transient episodes of altered awareness  The patient seems to be doing fairly well, a prescription for the Keppra has been called in recently. He will follow-up in one year, sooner if needed. The patient indicates that he does not operate a motor vehicle.  Marlan Palau. Keith Cieanna Stormes MD 05/02/2016 10:15 AM  [facility] [address] [place], [place] [address]  Phone [phone] Fax [phone]

## 2016-05-03 DIAGNOSIS — H5213 Myopia, bilateral: Secondary | ICD-10-CM | POA: Diagnosis not present

## 2016-05-09 DIAGNOSIS — Z23 Encounter for immunization: Secondary | ICD-10-CM | POA: Diagnosis not present

## 2016-08-15 DIAGNOSIS — L989 Disorder of the skin and subcutaneous tissue, unspecified: Secondary | ICD-10-CM | POA: Diagnosis not present

## 2016-08-15 DIAGNOSIS — E7800 Pure hypercholesterolemia, unspecified: Secondary | ICD-10-CM | POA: Diagnosis not present

## 2016-08-15 DIAGNOSIS — Z0000 Encounter for general adult medical examination without abnormal findings: Secondary | ICD-10-CM | POA: Diagnosis not present

## 2016-08-15 DIAGNOSIS — R569 Unspecified convulsions: Secondary | ICD-10-CM | POA: Diagnosis not present

## 2016-11-29 DIAGNOSIS — L578 Other skin changes due to chronic exposure to nonionizing radiation: Secondary | ICD-10-CM | POA: Diagnosis not present

## 2016-11-29 DIAGNOSIS — L570 Actinic keratosis: Secondary | ICD-10-CM | POA: Diagnosis not present

## 2017-02-17 DIAGNOSIS — C44729 Squamous cell carcinoma of skin of left lower limb, including hip: Secondary | ICD-10-CM | POA: Diagnosis not present

## 2017-02-17 DIAGNOSIS — L570 Actinic keratosis: Secondary | ICD-10-CM | POA: Diagnosis not present

## 2017-02-17 DIAGNOSIS — C44719 Basal cell carcinoma of skin of left lower limb, including hip: Secondary | ICD-10-CM | POA: Diagnosis not present

## 2017-04-04 DIAGNOSIS — Z23 Encounter for immunization: Secondary | ICD-10-CM | POA: Diagnosis not present

## 2017-04-25 ENCOUNTER — Telehealth: Payer: Self-pay | Admitting: Neurology

## 2017-04-25 MED ORDER — LEVETIRACETAM 750 MG PO TABS: 750.0000 mg | ORAL_TABLET | Freq: Two times a day (BID) | ORAL | 0 refills | Status: DC

## 2017-04-25 NOTE — Telephone Encounter (Signed)
Pt states he will run out of his levETIRAcetam (KEPPRA) 750 MG tablet a few days after his next appointment with Dr Anne HahnWillis, he would like to request a refill and is still [facility]  [facility] [place] - [address] AT [facility] [address] [address] & [address] [phone] (Phone) [phone] (Fax)

## 2017-04-25 NOTE — Telephone Encounter (Signed)
E-scribed refills to [facility] as requested per pt. Advised pt must keep yearly f/u to obtain future refills.

## 2017-04-25 NOTE — Addendum Note (Signed)
Addended by: Hillis RangeKING, Isaish Alemu L on: 04/25/2017 10:46 AM   Modules accepted: Orders

## 2017-05-01 ENCOUNTER — Other Ambulatory Visit: Payer: Self-pay

## 2017-05-01 ENCOUNTER — Encounter: Payer: Self-pay | Admitting: Neurology

## 2017-05-01 ENCOUNTER — Ambulatory Visit: Payer: Medicare Other | Admitting: Neurology

## 2017-05-01 VITALS — BP 118/70 | HR 62 | Ht 68.0 in | Wt 160.5 lb

## 2017-05-01 DIAGNOSIS — R55 Syncope and collapse: Secondary | ICD-10-CM

## 2017-05-01 DIAGNOSIS — F5104 Psychophysiologic insomnia: Secondary | ICD-10-CM

## 2017-05-01 HISTORY — DX: Psychophysiologic insomnia: F51.04

## 2017-05-01 MED ORDER — TRAZODONE HCL 50 MG PO TABS: 50.0000 mg | ORAL_TABLET | Freq: Every day | ORAL | 1 refills | Status: DC

## 2017-05-01 NOTE — Progress Notes (Signed)
Reason for visit: Transient episodes of altered awareness  Leon Kennedy is an 71 y.o. male  History of present illness:  Leon Kennedy is a 71 year old left-handed white male with a history of problems with episodes of loss of awareness that last only a few minutes, these episodes may occur on average once a month.  The patient does not operate a motor vehicle.  He is on Keppra for this.  He is reporting a chronic problem with insomnia, he oftentimes takes an hour and a half or 2 hours to get to sleep and this may only sleep 3-4 hours a night.  The patient has taken an over-the-counter sleep aid which does not appear to be helpful.  The patient does not sleep during the day.  He feels somewhat irritable at times, he has had some reduction in memory.  He recently had skin cancers taken off the lower extremities on both sides.  He returns to this office for an evaluation.  Past Medical History:  Diagnosis Date  . Depression age mid 4440's and early 6060's  . Headache    sinus  . Meningitis spinal age 72 months  . Near syncope 05/05/2014   fainting spells, none in years  . Pneumonia 2015, 2017  . Seizures (HCC) last 4  to 5 years ago    Past Surgical History:  Procedure Laterality Date  . TONSILLECTOMY  age 89 or 26    Family History  Problem Relation Age of Onset  . Dementia Mother   . Depression Mother   . Heart attack Father   . Diabetes Father     Social history:  reports that he has quit smoking. His smoking use included cigarettes. He has a 20.00 pack-year smoking history. he has never used smokeless tobacco. He reports that he drinks alcohol. He reports that he does not use drugs.    Allergies  Allergen Reactions  . Sulfa Antibiotics Anaphylaxis    Childhood reaction    Medications:  Prior to Admission medications   Medication Sig Start Date End Date Taking? Authorizing Provider  Ascorbic Acid (VITAMIN C) 1000 MG tablet Take 1,000 mg daily by mouth. Takes 2000 mg  if he is sick   Yes [provider]  aspirin 81 MG tablet Take 81 mg by mouth 3 (three) times a week.    Yes [provider]  Coenzyme Q10 (CO Q 10) 10 MG CAPS Take 100 mg by mouth daily.    Yes [provider]  diphenhydrAMINE (BENADRYL) 25 MG tablet Take 25 mg by mouth at bedtime.   Yes [provider]  docusate sodium (COLACE) 100 MG capsule Take 100 mg by mouth daily.    Yes [provider]  Echinacea 400 MG CAPS Take 500 mg by mouth daily as needed ("feeling sick").    Yes [provider]  Lactobacillus (PROBIOTIC ACIDOPHILUS PO) Take 1 Dose daily by mouth. 25 billion per dose   Yes [provider]  levETIRAcetam (KEPPRA) 750 MG tablet Take 1 tablet (750 mg total) every 12 (twelve) hours by mouth. 04/25/17  Yes York SpanielWillis, Melik Blancett K, MD  MAGNESIUM GLYCINATE PLUS PO Take 400 mg by mouth daily.   Yes [provider]  Multiple Vitamin (MULTIVITAMIN) tablet Take 1 tablet daily by mouth. [employer] Mega Men 50+ Vita Pak   Yes [provider]  OVER THE COUNTER MEDICATION Take 3 tablets daily by mouth. Advanced Bionutritionals: Advanced sleep formula dietary supplement   Yes  [provider]  QUERCETIN PO Take 500 mg daily by mouth.   Yes [provider]    ROS:  Out of a complete 14 system review of symptoms, the patient complains only of the following symptoms, and all other reviewed systems are negative.  Fatigue Runny nose, draining sinuses Frequency of urination, urinary urgency Bruising easily Memory loss Agitation, anxiety    Blood pressure 118/70, pulse 62, height 5\' 8"  (1.727 m), weight 160 lb 8 oz (72.8 kg).  Physical Exam  General: The patient is alert and cooperative at the time of the examination.  Skin: No significant peripheral edema is noted.   Neurologic Exam  Mental status: The patient is alert and oriented x 3 at the time of the examination. The patient has apparent normal  recent and remote memory, with an apparently normal attention span and concentration ability.   Cranial nerves: Facial symmetry is present. Speech is normal, no aphasia or dysarthria is noted. Extraocular movements are full. Visual fields are full.  Motor: The patient has good strength in all 4 extremities.  Sensory examination: Soft touch sensation is symmetric on the face, arms, and legs.  Coordination: The patient has good finger-nose-finger and heel-to-shin bilaterally.  Gait and station: The patient has a normal gait. Tandem gait is normal. Romberg is negative. No drift is seen.  Reflexes: Deep tendon reflexes are symmetric.   Assessment/Plan:  1.  Chronic insomnia  2.  Transient episodes of loss of awareness  The patient will continue the Keppra, he is having chronic problems with insomnia, trazodone will be added in the evening at 50 mg at night.  The patient will contact our office for any dose adjustments.  Otherwise, he will follow-up in 1 year.  Leon Palau. Keith Jerame Hedding MD 05/01/2017 10:32 AM  [facility] [address] [place], [place] [address]  Phone [phone] Fax [phone]

## 2017-05-01 NOTE — Patient Instructions (Signed)
   Try Trazodone for sleep.

## 2017-05-03 DIAGNOSIS — Z85828 Personal history of other malignant neoplasm of skin: Secondary | ICD-10-CM | POA: Diagnosis not present

## 2017-05-03 DIAGNOSIS — L905 Scar conditions and fibrosis of skin: Secondary | ICD-10-CM | POA: Diagnosis not present

## 2017-05-04 MED ORDER — LEVETIRACETAM 750 MG PO TABS: 750.0000 mg | ORAL_TABLET | Freq: Two times a day (BID) | ORAL | 3 refills | Status: DC

## 2017-05-04 NOTE — Telephone Encounter (Signed)
Pt called he is wanting refills for a year. Please call to advise if this is possible.

## 2017-05-04 NOTE — Addendum Note (Signed)
Addended by: York SpanielWILLIS, CHARLES K on: 05/04/2017 06:46 PM   Modules accepted: Orders

## 2017-05-04 NOTE — Telephone Encounter (Signed)
A prescription was sent in for Keppra with a 90-day supply with 3 refills.

## 2017-05-15 DIAGNOSIS — H5213 Myopia, bilateral: Secondary | ICD-10-CM | POA: Diagnosis not present

## 2017-05-28 ENCOUNTER — Other Ambulatory Visit: Payer: Self-pay | Admitting: Neurology

## 2017-06-26 ENCOUNTER — Other Ambulatory Visit: Payer: Self-pay | Admitting: Neurology

## 2017-08-14 DIAGNOSIS — E7800 Pure hypercholesterolemia, unspecified: Secondary | ICD-10-CM | POA: Diagnosis not present

## 2017-08-14 DIAGNOSIS — Z0000 Encounter for general adult medical examination without abnormal findings: Secondary | ICD-10-CM | POA: Diagnosis not present

## 2017-08-14 DIAGNOSIS — R569 Unspecified convulsions: Secondary | ICD-10-CM | POA: Diagnosis not present

## 2017-08-14 DIAGNOSIS — Z1389 Encounter for screening for other disorder: Secondary | ICD-10-CM | POA: Diagnosis not present

## 2017-10-31 DIAGNOSIS — Z85828 Personal history of other malignant neoplasm of skin: Secondary | ICD-10-CM | POA: Diagnosis not present

## 2017-10-31 DIAGNOSIS — L570 Actinic keratosis: Secondary | ICD-10-CM | POA: Diagnosis not present

## 2017-10-31 DIAGNOSIS — L905 Scar conditions and fibrosis of skin: Secondary | ICD-10-CM | POA: Diagnosis not present

## 2017-12-04 DIAGNOSIS — L819 Disorder of pigmentation, unspecified: Secondary | ICD-10-CM | POA: Diagnosis not present

## 2017-12-04 DIAGNOSIS — L570 Actinic keratosis: Secondary | ICD-10-CM | POA: Diagnosis not present

## 2018-03-05 DIAGNOSIS — L905 Scar conditions and fibrosis of skin: Secondary | ICD-10-CM | POA: Diagnosis not present

## 2018-03-05 DIAGNOSIS — D485 Neoplasm of uncertain behavior of skin: Secondary | ICD-10-CM | POA: Diagnosis not present

## 2018-03-05 DIAGNOSIS — Z85828 Personal history of other malignant neoplasm of skin: Secondary | ICD-10-CM | POA: Diagnosis not present

## 2018-03-05 DIAGNOSIS — L570 Actinic keratosis: Secondary | ICD-10-CM | POA: Diagnosis not present

## 2018-03-14 DIAGNOSIS — L570 Actinic keratosis: Secondary | ICD-10-CM | POA: Diagnosis not present

## 2018-03-26 DIAGNOSIS — C44619 Basal cell carcinoma of skin of left upper limb, including shoulder: Secondary | ICD-10-CM | POA: Diagnosis not present

## 2018-04-16 DIAGNOSIS — Z23 Encounter for immunization: Secondary | ICD-10-CM | POA: Diagnosis not present

## 2018-05-07 ENCOUNTER — Other Ambulatory Visit: Payer: Self-pay

## 2018-05-07 ENCOUNTER — Encounter: Payer: Self-pay | Admitting: Neurology

## 2018-05-07 ENCOUNTER — Ambulatory Visit: Payer: Medicare Other | Admitting: Neurology

## 2018-05-07 VITALS — BP 120/77 | HR 82 | Resp 16 | Ht 68.0 in | Wt 158.0 lb

## 2018-05-07 DIAGNOSIS — R55 Syncope and collapse: Secondary | ICD-10-CM

## 2018-05-07 DIAGNOSIS — F5104 Psychophysiologic insomnia: Secondary | ICD-10-CM

## 2018-05-07 MED ORDER — LEVETIRACETAM 750 MG PO TABS: 750.0000 mg | ORAL_TABLET | Freq: Two times a day (BID) | ORAL | 3 refills | Status: DC

## 2018-05-07 MED ORDER — TRAZODONE HCL 50 MG PO TABS: ORAL_TABLET | ORAL | 3 refills | Status: DC

## 2018-05-07 NOTE — Progress Notes (Signed)
Reason for visit: Transient alteration of awareness, chronic insomnia  Draylon Mercadel is an 72 y.o. male  History of present illness:  Mr. Gladu is a 72 year old left-handed white male with a history of episodes of "phasing out" that may occur off and on.  He has been sleeping better on trazodone, taking 50 mg at night.  He has had only one brief episode bit of occurred within the last year.  He is pleased with how he is doing.  He will take a nap in the middle the day most days of the week.  This seems to help him.  The patient returns to this office for an evaluation.  He does not report any other significant medical problems since last seen.  Past Medical History:  Diagnosis Date  . Chronic insomnia 05/01/2017  . Depression age mid 56's and early 68's  . Headache    sinus  . Meningitis spinal age 12 months  . Near syncope 05/05/2014   fainting spells, none in years  . Pneumonia 2015, 2017  . Seizures (HCC) last 4  to 5 years ago    Past Surgical History:  Procedure Laterality Date  . COLONOSCOPY WITH PROPOFOL N/A 03/09/2015   Procedure: COLONOSCOPY WITH PROPOFOL;  Surgeon: Charolett Bumpers, MD;  Location: [facility];  Service: Endoscopy;  Laterality: N/A;  . TONSILLECTOMY  age 64 or 89    Family History  Problem Relation Age of Onset  . Dementia Mother   . Depression Mother   . Heart attack Father   . Diabetes Father     Social history:  reports that he has quit smoking. His smoking use included cigarettes. He has a 20.00 pack-year smoking history. He has never used smokeless tobacco. He reports that he drinks alcohol. He reports that he does not use drugs.    Allergies  Allergen Reactions  . Sulfa Antibiotics Anaphylaxis    Childhood reaction    Medications:  Prior to Admission medications   Medication Sig Start Date End Date Taking? Authorizing Provider  Ascorbic Acid (VITAMIN C) 1000 MG tablet Take 1,000 mg daily by mouth. Takes 2000 mg if he is sick    Yes [provider]  Coenzyme Q10 (CO Q 10) 10 MG CAPS Take 100 mg by mouth daily.    Yes [provider]  diphenhydrAMINE (BENADRYL) 25 MG tablet Take 25 mg by mouth at bedtime.   Yes [provider]  Lactobacillus (PROBIOTIC ACIDOPHILUS PO) Take 1 Dose daily by mouth. 25 billion per dose   Yes [provider]  levETIRAcetam (KEPPRA) 750 MG tablet Take 1 tablet (750 mg total) every 12 (twelve) hours by mouth. 05/04/17  Yes York Spaniel, MD  MAGNESIUM GLYCINATE PLUS PO Take 400 mg by mouth daily.   Yes [provider]  Multiple Vitamin (MULTIVITAMIN) tablet Take 1 tablet daily by mouth. [employer] Mega Men 50+ Vita Pak   Yes [provider]  OVER THE COUNTER MEDICATION Take 3 tablets daily by mouth. Advanced Bionutritionals: Advanced sleep formula dietary supplement   Yes [provider]  QUERCETIN PO Take 500 mg daily by mouth.   Yes [provider]  traZODone (DESYREL) 50 MG tablet TAKE 1 TABLET(50 MG) BY MOUTH AT BEDTIME 06/26/17  Yes York Spaniel, MD    ROS:  Out of a complete 14 system review of symptoms, the patient complains only of the following symptoms, and all other reviewed systems are negative.  Transient  alteration of awareness  Blood pressure 120/77, pulse 82, resp. rate 16, height 5\' 8"  (1.727 m), weight 158 lb (71.7 kg).  Physical Exam  General: The patient is alert and cooperative at the time of the examination.  Skin: No significant peripheral edema is noted.   Neurologic Exam  Mental status: The patient is alert and oriented x 3 at the time of the examination. The patient has apparent normal recent and remote memory, with an apparently normal attention span and concentration ability.   Cranial nerves: Facial symmetry is present. Speech is normal, no aphasia or dysarthria is noted. Extraocular movements are full. Visual fields are full.  Motor: The patient has good strength in all 4  extremities.  Sensory examination: Soft touch sensation is symmetric on the face, arms, and legs.  Coordination: The patient has good finger-nose-finger and heel-to-shin bilaterally.  Gait and station: The patient has a normal gait. Tandem gait is normal. Romberg is negative. No drift is seen.  Reflexes: Deep tendon reflexes are symmetric.   Assessment/Plan:  1.  Transient alteration of awareness  2.  Chronic insomnia  Overall, the patient is doing fairly well.  Improved sleep patterns have helped his face out episodes.  He will be given a prescription for the Keppra and for the trazodone.  The patient will follow-up in 1 year.  Marlan Palau. Keith Luc Shammas MD 05/07/2018 9:55 AM  [facility] [address] [place], [place] [address]  Phone [phone] Fax [phone]

## 2018-05-28 DIAGNOSIS — H5213 Myopia, bilateral: Secondary | ICD-10-CM | POA: Diagnosis not present

## 2018-07-23 ENCOUNTER — Telehealth: Payer: Self-pay | Admitting: Neurology

## 2018-07-23 MED ORDER — LEVETIRACETAM 750 MG PO TABS: 750.0000 mg | ORAL_TABLET | Freq: Two times a day (BID) | ORAL | 3 refills | Status: DC

## 2018-07-23 NOTE — Telephone Encounter (Signed)
Pt is asking for a refill on his levETIRAcetam (KEPPRA) 750 MG tablet [facility]- Pt is asking for a call of confirmation once the order has been submitted to [facility]

## 2018-07-23 NOTE — Telephone Encounter (Signed)
Pt has been compliant with f/u and no med changes have been made. Refill given for a 90 day supply to [facility].

## 2018-07-23 NOTE — Addendum Note (Signed)
Addended by: Ann Maki T on: 07/23/2018 09:47 AM   Modules accepted: Orders

## 2018-08-13 DIAGNOSIS — C44519 Basal cell carcinoma of skin of other part of trunk: Secondary | ICD-10-CM | POA: Diagnosis not present

## 2018-08-13 DIAGNOSIS — L578 Other skin changes due to chronic exposure to nonionizing radiation: Secondary | ICD-10-CM | POA: Diagnosis not present

## 2018-08-13 DIAGNOSIS — Z85828 Personal history of other malignant neoplasm of skin: Secondary | ICD-10-CM | POA: Diagnosis not present

## 2018-08-13 DIAGNOSIS — L570 Actinic keratosis: Secondary | ICD-10-CM | POA: Diagnosis not present

## 2018-08-13 DIAGNOSIS — C44229 Squamous cell carcinoma of skin of left ear and external auricular canal: Secondary | ICD-10-CM | POA: Diagnosis not present

## 2018-08-13 DIAGNOSIS — D485 Neoplasm of uncertain behavior of skin: Secondary | ICD-10-CM | POA: Diagnosis not present

## 2018-08-20 DIAGNOSIS — E7800 Pure hypercholesterolemia, unspecified: Secondary | ICD-10-CM | POA: Diagnosis not present

## 2018-08-20 DIAGNOSIS — Z1389 Encounter for screening for other disorder: Secondary | ICD-10-CM | POA: Diagnosis not present

## 2018-08-20 DIAGNOSIS — R569 Unspecified convulsions: Secondary | ICD-10-CM | POA: Diagnosis not present

## 2018-08-20 DIAGNOSIS — R0981 Nasal congestion: Secondary | ICD-10-CM | POA: Diagnosis not present

## 2018-08-20 DIAGNOSIS — Z0000 Encounter for general adult medical examination without abnormal findings: Secondary | ICD-10-CM | POA: Diagnosis not present

## 2018-08-20 DIAGNOSIS — F5104 Psychophysiologic insomnia: Secondary | ICD-10-CM | POA: Diagnosis not present

## 2018-09-11 DIAGNOSIS — L578 Other skin changes due to chronic exposure to nonionizing radiation: Secondary | ICD-10-CM | POA: Diagnosis not present

## 2018-09-26 DIAGNOSIS — C44519 Basal cell carcinoma of skin of other part of trunk: Secondary | ICD-10-CM | POA: Diagnosis not present

## 2018-09-26 DIAGNOSIS — D0422 Carcinoma in situ of skin of left ear and external auricular canal: Secondary | ICD-10-CM | POA: Diagnosis not present

## 2019-01-07 DIAGNOSIS — D1801 Hemangioma of skin and subcutaneous tissue: Secondary | ICD-10-CM | POA: Diagnosis not present

## 2019-01-07 DIAGNOSIS — D229 Melanocytic nevi, unspecified: Secondary | ICD-10-CM | POA: Diagnosis not present

## 2019-01-07 DIAGNOSIS — L821 Other seborrheic keratosis: Secondary | ICD-10-CM | POA: Diagnosis not present

## 2019-01-07 DIAGNOSIS — L814 Other melanin hyperpigmentation: Secondary | ICD-10-CM | POA: Diagnosis not present

## 2019-02-13 DIAGNOSIS — L578 Other skin changes due to chronic exposure to nonionizing radiation: Secondary | ICD-10-CM | POA: Diagnosis not present

## 2019-04-22 DIAGNOSIS — M25511 Pain in right shoulder: Secondary | ICD-10-CM | POA: Diagnosis not present

## 2019-05-20 ENCOUNTER — Ambulatory Visit: Payer: Medicare Other | Admitting: Adult Health

## 2019-05-20 ENCOUNTER — Encounter: Payer: Self-pay | Admitting: Adult Health

## 2019-05-20 ENCOUNTER — Other Ambulatory Visit: Payer: Self-pay

## 2019-05-20 VITALS — BP 122/70 | HR 98 | Temp 97.8°F | Ht 68.0 in | Wt 148.8 lb

## 2019-05-20 DIAGNOSIS — F5104 Psychophysiologic insomnia: Secondary | ICD-10-CM

## 2019-05-20 DIAGNOSIS — R55 Syncope and collapse: Secondary | ICD-10-CM | POA: Diagnosis not present

## 2019-05-20 MED ORDER — TRAZODONE HCL 50 MG PO TABS: ORAL_TABLET | ORAL | 3 refills | Status: DC

## 2019-05-20 NOTE — Patient Instructions (Signed)
Your Plan:  Continue Keppra and Trazdone If your symptoms worsen or you develop new symptoms please let us know.   Thank you for coming to see [place] at [facility]. I hope we have been able to provide you high quality care today.  You may receive a patient satisfaction survey over the next few weeks. We would appreciate your feedback and comments so that we may continue to improve ourselves and the health of our patients.

## 2019-05-20 NOTE — Progress Notes (Signed)
PATIENT: Leon Kennedy DOB: January 25, 1946  REASON FOR VISIT: follow up HISTORY FROM: patient  HISTORY OF PRESENT ILLNESS: Today 05/20/19:  Leon Kennedy is a 73 year old male with a history of "phasing out" episodes that have been occurring since he was 73 years old.  He is currently on Keppra and states he has been on this medication for the last 10 years.  He states that his episodes are very infrequent.  He states over the last year he may have 2 episodes that he describes as spacey.  He denies loss of consciousness.  He reports that usually his wife notices the episodes that she states that she may tell him something and he does not respond.  He continues on trazodone 50 mg at bedtime.  He reports that this is working well for his sleep.  Overall he feels that he is doing well.  He returns today for an evaluation.  HISTORY Leon Kennedy is a 73 year old left-handed white male with a history of episodes of "phasing out" that may occur off and on.  He has been sleeping better on trazodone, taking 50 mg at night.  He has had only one brief episode bit of occurred within the last year.  He is pleased with how he is doing.  He will take a nap in the middle the day most days of the week.  This seems to help him.  The patient returns to this office for an evaluation.  He does not report any other significant medical problems since last seen.  REVIEW OF SYSTEMS: Out of a complete 14 system review of symptoms, the patient complains only of the following symptoms, and all other reviewed systems are negative.  See HPI  ALLERGIES: Allergies  Allergen Reactions  . Sulfa Antibiotics Anaphylaxis    Childhood reaction    HOME MEDICATIONS: Outpatient Medications Prior to Visit  Medication Sig Dispense Refill  . Ascorbic Acid (VITAMIN C) 1000 MG tablet Take 1,000 mg daily by mouth. Takes 2000 mg if he is sick    . Coenzyme Q10 (CO Q 10) 10 MG CAPS Take 100 mg by mouth daily.     . diphenhydrAMINE  (BENADRYL) 25 MG tablet Take 25 mg by mouth at bedtime.    . Lactobacillus (PROBIOTIC ACIDOPHILUS PO) Take 1 Dose daily by mouth. 25 billion per dose    . levETIRAcetam (KEPPRA) 750 MG tablet Take 1 tablet (750 mg total) by mouth every 12 (twelve) hours. 180 tablet 3  . MAGNESIUM GLYCINATE PLUS PO Take 400 mg by mouth daily.    . Multiple Vitamin (MULTIVITAMIN) tablet Take 1 tablet daily by mouth. [employer] Mega Men 50+ Vita Pak    . OVER THE COUNTER MEDICATION Take 3 tablets daily by mouth. Advanced Bionutritionals: Advanced sleep formula dietary supplement    . QUERCETIN PO Take 500 mg daily by mouth.    . traZODone (DESYREL) 50 MG tablet TAKE 1 TABLET(50 MG) BY MOUTH AT BEDTIME 90 tablet 3   No facility-administered medications prior to visit.     PAST MEDICAL HISTORY: Past Medical History:  Diagnosis Date  . Chronic insomnia 05/01/2017  . Depression age mid 7440's and early 4860's  . Headache    sinus  . Meningitis spinal age 74 months  . Near syncope 05/05/2014   fainting spells, none in years  . Pneumonia 2015, 2017  . Seizures (HCC) last 4  to 5 years ago    PAST SURGICAL HISTORY: Past Surgical History:  Procedure Laterality Date  . COLONOSCOPY WITH PROPOFOL N/A 03/09/2015   Procedure: COLONOSCOPY WITH PROPOFOL;  Surgeon: Garlan Fair, MD;  Location: [facility];  Service: Endoscopy;  Laterality: N/A;  . TONSILLECTOMY  age 28 or 6    FAMILY HISTORY: Family History  Problem Relation Age of Onset  . Dementia Mother   . Depression Mother   . Heart attack Father   . Diabetes Father     SOCIAL HISTORY: Social History   Socioeconomic History  . Marital status: Married    Spouse name: Not on file  . Number of children: 1  . Years of education: COLLEGE  . Highest education level: Not on file  Occupational History  . Occupation: RETIRED ACCOUNTANT  Social Needs  . Financial resource strain: Not on file  . Food insecurity    Worry: Not on file    Inability: Not on file   . Transportation needs    Medical: Not on file    Non-medical: Not on file  Tobacco Use  . Smoking status: Former Smoker    Packs/day: 1.00    Years: 20.00    Pack years: 20.00    Types: Cigarettes  . Smokeless tobacco: Never Used  . Tobacco comment: quit smoking age 46  Substance and Sexual Activity  . Alcohol use: Yes    Comment: OCCASIONAL  . Drug use: No  . Sexual activity: Not on file  Lifestyle  . Physical activity    Days per week: Not on file    Minutes per session: Not on file  . Stress: Not on file  Relationships  . Social Herbalist on phone: Not on file    Gets together: Not on file    Attends religious service: Not on file    Active member of club or organization: Not on file    Attends meetings of clubs or organizations: Not on file    Relationship status: Not on file  . Intimate partner violence    Fear of current or ex partner: Not on file    Emotionally abused: Not on file    Physically abused: Not on file    Forced sexual activity: Not on file  Other Topics Concern  . Not on file  Social History Narrative   Lives at home w/ his wife   Left-handed    Caffeine: 2 cups of coffee per day and occasional Coke      PHYSICAL EXAM  Vitals:   05/20/19 0918  BP: 122/70  Pulse: 98  Temp: 97.8 F (36.6 C)  Weight: 148 lb 12.8 oz (67.5 kg)  Height: 5\' 8"  (1.727 m)   Body mass index is 22.62 kg/m.  Generalized: Well developed, in no acute distress  Chest: Lungs clear to auscultation bilaterally  Neurological examination  Mentation: Alert oriented to time, place, history taking. Follows all commands speech and language fluent Cranial nerve II-XII: Extraocular movements were full, visual field were full on confrontational test Head turning and shoulder shrug  were normal and symmetric. Motor: The motor testing reveals 5 over 5 strength of all 4 extremities. Good symmetric motor tone is noted throughout.  Sensory: Sensory testing is intact  to soft touch on all 4 extremities. No evidence of extinction is noted.  Gait and station: Gait is normal.   DIAGNOSTIC DATA (LABS, IMAGING, TESTING) - I reviewed patient records, labs, notes, testing and imaging myself where available.     ASSESSMENT AND PLAN 73 y.o. year  old male  has a past medical history of Chronic insomnia (05/01/2017), Depression (age mid 40's and early 22's), Headache, Meningitis spinal (age 73 months), Near syncope (05/05/2014), Pneumonia (2015, 2017), and Seizures (HCC) (last 4  to 5 years ago). here with:  1.  Seizures 2.  Insomnia  Overall the patient is doing well.  He will continue on Keppra and trazodone.  Advised that if his episodes increase or he develops new symptoms he should let us know.  He will follow-up in 1 year or sooner if needed.     Butch Penny, MSN, NP-C 05/20/2019, 9:32 AM [facility] [address] [place], [place] [address] [phone]

## 2019-05-21 DIAGNOSIS — L570 Actinic keratosis: Secondary | ICD-10-CM | POA: Diagnosis not present

## 2019-05-21 DIAGNOSIS — Z85828 Personal history of other malignant neoplasm of skin: Secondary | ICD-10-CM | POA: Diagnosis not present

## 2019-05-21 DIAGNOSIS — L819 Disorder of pigmentation, unspecified: Secondary | ICD-10-CM | POA: Diagnosis not present

## 2019-05-21 DIAGNOSIS — L718 Other rosacea: Secondary | ICD-10-CM | POA: Diagnosis not present

## 2019-05-21 NOTE — Progress Notes (Signed)
I have read the note, and I agree with the clinical assessment and plan.  Altamese Deguire K Jaxie Racanelli   

## 2019-05-28 DIAGNOSIS — M25511 Pain in right shoulder: Secondary | ICD-10-CM | POA: Diagnosis not present

## 2019-08-12 ENCOUNTER — Other Ambulatory Visit: Payer: Self-pay | Admitting: Neurology

## 2019-08-26 DIAGNOSIS — R569 Unspecified convulsions: Secondary | ICD-10-CM | POA: Diagnosis not present

## 2019-08-26 DIAGNOSIS — F5104 Psychophysiologic insomnia: Secondary | ICD-10-CM | POA: Diagnosis not present

## 2019-08-26 DIAGNOSIS — Z0000 Encounter for general adult medical examination without abnormal findings: Secondary | ICD-10-CM | POA: Diagnosis not present

## 2019-08-26 DIAGNOSIS — N400 Enlarged prostate without lower urinary tract symptoms: Secondary | ICD-10-CM | POA: Diagnosis not present

## 2019-08-26 DIAGNOSIS — E7800 Pure hypercholesterolemia, unspecified: Secondary | ICD-10-CM | POA: Diagnosis not present

## 2019-08-26 DIAGNOSIS — Z1389 Encounter for screening for other disorder: Secondary | ICD-10-CM | POA: Diagnosis not present

## 2019-09-03 DIAGNOSIS — H5213 Myopia, bilateral: Secondary | ICD-10-CM | POA: Diagnosis not present

## 2019-11-14 DIAGNOSIS — L821 Other seborrheic keratosis: Secondary | ICD-10-CM | POA: Diagnosis not present

## 2019-11-14 DIAGNOSIS — L819 Disorder of pigmentation, unspecified: Secondary | ICD-10-CM | POA: Diagnosis not present

## 2019-11-14 DIAGNOSIS — D1801 Hemangioma of skin and subcutaneous tissue: Secondary | ICD-10-CM | POA: Diagnosis not present

## 2019-11-14 DIAGNOSIS — L814 Other melanin hyperpigmentation: Secondary | ICD-10-CM | POA: Diagnosis not present

## 2019-11-14 DIAGNOSIS — C44712 Basal cell carcinoma of skin of right lower limb, including hip: Secondary | ICD-10-CM | POA: Diagnosis not present

## 2019-12-04 DIAGNOSIS — C44712 Basal cell carcinoma of skin of right lower limb, including hip: Secondary | ICD-10-CM | POA: Diagnosis not present

## 2019-12-04 DIAGNOSIS — D485 Neoplasm of uncertain behavior of skin: Secondary | ICD-10-CM | POA: Diagnosis not present

## 2020-01-10 DIAGNOSIS — C44712 Basal cell carcinoma of skin of right lower limb, including hip: Secondary | ICD-10-CM | POA: Diagnosis not present

## 2020-02-20 DIAGNOSIS — D485 Neoplasm of uncertain behavior of skin: Secondary | ICD-10-CM | POA: Diagnosis not present

## 2020-05-18 ENCOUNTER — Other Ambulatory Visit: Payer: Self-pay | Admitting: Adult Health

## 2020-05-25 ENCOUNTER — Encounter: Payer: Self-pay | Admitting: Adult Health

## 2020-05-25 ENCOUNTER — Ambulatory Visit: Payer: Medicare Other | Admitting: Adult Health

## 2020-05-25 VITALS — BP 108/67 | HR 69 | Ht 68.0 in | Wt 145.0 lb

## 2020-05-25 DIAGNOSIS — R55 Syncope and collapse: Secondary | ICD-10-CM

## 2020-05-25 DIAGNOSIS — F5104 Psychophysiologic insomnia: Secondary | ICD-10-CM

## 2020-05-25 MED ORDER — TRAZODONE HCL 50 MG PO TABS: ORAL_TABLET | ORAL | 3 refills | Status: DC

## 2020-05-25 MED ORDER — LEVETIRACETAM 750 MG PO TABS: ORAL_TABLET | ORAL | 3 refills | Status: AC

## 2020-05-25 NOTE — Progress Notes (Signed)
I have read the note, and I agree with the clinical assessment and plan.  Saranya Harlin K Blair Lundeen   

## 2020-05-25 NOTE — Progress Notes (Signed)
PATIENT: Leon Kennedy DOB: Jul 04, 1945  REASON FOR VISIT: follow up HISTORY FROM: patient  HISTORY OF PRESENT ILLNESS: Today 05/25/20:  Leon Kennedy is a 74 year old male with a history of transient loss of consciousness  and insomnia.  He returns today for follow-up.  He continues on Keppra 750 mg twice a day.  Reports that he tolerates this well.  States that over the last year he has had 3-4 episodes that he describes as "spacey moments."  He states that these only last for several seconds.  He remains on trazodone 50 mg at bedtime. He reports that he sleeps relatively well. He is able to complete all ADLs independently.  He does not operate a motor vehicle.    05/20/19: Leon Kennedy is a 74 year old male with a history of "phasing out" episodes that have been occurring since he was 74 years old.  He is currently on Keppra and states he has been on this medication for the last 10 years.  He states that his episodes are very infrequent.  He states over the last year he may have 2 episodes that he describes as spacey.  He denies loss of consciousness.  He reports that usually his wife notices the episodes that she states that she may tell him something and he does not respond.  He continues on trazodone 50 mg at bedtime.  He reports that this is working well for his sleep.  Overall he feels that he is doing well.  He returns today for an evaluation.  HISTORY Leon Kennedy is a 74 year old left-handed white male with a history of episodes of "phasing out" that may occur off and on.  He has been sleeping better on trazodone, taking 50 mg at night.  He has had only one brief episode bit of occurred within the last year.  He is pleased with how he is doing.  He will take a nap in the middle the day most days of the week.  This seems to help him.  The patient returns to this office for an evaluation.  He does not report any other significant medical problems since last seen.  REVIEW OF SYSTEMS:  Out of a complete 14 system review of symptoms, the patient complains only of the following symptoms, and all other reviewed systems are negative.  Epworth sleepiness score 1  ALLERGIES: Allergies  Allergen Reactions  . Sulfa Antibiotics Anaphylaxis    Childhood reaction    HOME MEDICATIONS: Outpatient Medications Prior to Visit  Medication Sig Dispense Refill  . Ascorbic Acid (VITAMIN C) 1000 MG tablet Take 1,000 mg daily by mouth. Takes 2000 mg if he is sick    . Coenzyme Q10 (CO Q 10) 10 MG CAPS Take 100 mg by mouth daily.     . diphenhydrAMINE (BENADRYL) 25 MG tablet Take 25 mg by mouth at bedtime.    . Lactobacillus (PROBIOTIC ACIDOPHILUS PO) Take 1 Dose daily by mouth. 25 billion per dose    . levETIRAcetam (KEPPRA) 750 MG tablet TAKE 1 TABLET BY MOUTH EVERY 12 HOURS. GENERIC EQUIVALENT FOR KEPPRA 180 tablet 3  . MAGNESIUM GLYCINATE PLUS PO Take 400 mg by mouth daily.    . Multiple Vitamin (MULTIVITAMIN) tablet Take 1 tablet daily by mouth. [employer] Mega Men 50+ Vita Pak    . OVER THE COUNTER MEDICATION Take 3 tablets daily by mouth. Advanced Bionutritionals: Advanced sleep formula dietary supplement    . QUERCETIN PO Take 500 mg daily by mouth.    Marland Kitchen  traZODone (DESYREL) 50 MG tablet TAKE 1 TABLET(50 MG) BY MOUTH AT BEDTIME 30 tablet 0   No facility-administered medications prior to visit.    PAST MEDICAL HISTORY: Past Medical History:  Diagnosis Date  . Chronic insomnia 05/01/2017  . Depression age mid 33's and early 75's  . Headache    sinus  . Meningitis spinal age 90 months  . Near syncope 05/05/2014   fainting spells, none in years  . Pneumonia 2015, 2017  . Seizures (HCC) last 4  to 5 years ago    PAST SURGICAL HISTORY: Past Surgical History:  Procedure Laterality Date  . COLONOSCOPY WITH PROPOFOL N/A 03/09/2015   Procedure: COLONOSCOPY WITH PROPOFOL;  Surgeon: Charolett Bumpers, MD;  Location: [facility];  Service: Endoscopy;  Laterality: N/A;  . TONSILLECTOMY   age 73 or 6    FAMILY HISTORY: Family History  Problem Relation Age of Onset  . Dementia Mother   . Depression Mother   . Heart attack Father   . Diabetes Father     SOCIAL HISTORY: Social History   Socioeconomic History  . Marital status: Married    Spouse name: Not on file  . Number of children: 1  . Years of education: COLLEGE  . Highest education level: Not on file  Occupational History  . Occupation: RETIRED ACCOUNTANT  Tobacco Use  . Smoking status: Former Smoker    Packs/day: 1.00    Years: 20.00    Pack years: 20.00    Types: Cigarettes  . Smokeless tobacco: Never Used  . Tobacco comment: quit smoking age 34  Substance and Sexual Activity  . Alcohol use: Yes    Comment: OCCASIONAL  . Drug use: No  . Sexual activity: Not on file  Other Topics Concern  . Not on file  Social History Narrative   Lives at home w/ his wife   Left-handed    Caffeine: 2 cups of coffee per day and occasional Coke   Social Determinants of Health   Financial Resource Strain:   . Difficulty of Paying Living Expenses: Not on file  Food Insecurity:   . Worried About Programme researcher, broadcasting/film/video in the Last Year: Not on file  . Ran Out of Food in the Last Year: Not on file  Transportation Needs:   . Lack of Transportation (Medical): Not on file  . Lack of Transportation (Non-Medical): Not on file  Physical Activity:   . Days of Exercise per Week: Not on file  . Minutes of Exercise per Session: Not on file  Stress:   . Feeling of Stress : Not on file  Social Connections:   . Frequency of Communication with Friends and Family: Not on file  . Frequency of Social Gatherings with Friends and Family: Not on file  . Attends Religious Services: Not on file  . Active Member of Clubs or Organizations: Not on file  . Attends Banker Meetings: Not on file  . Marital Status: Not on file  Intimate Partner Violence:   . Fear of Current or Ex-Partner: Not on file  . Emotionally Abused:  Not on file  . Physically Abused: Not on file  . Sexually Abused: Not on file      PHYSICAL EXAM  Vitals:   05/25/20 1023  BP: 108/67  Pulse: 69  Weight: 145 lb (65.8 kg)  Height: 5\' 8"  (1.727 m)   Body mass index is 22.05 kg/m.  Generalized: Well developed, in no acute distress  Chest:  Lungs clear to auscultation bilaterally  Neurological examination  Mentation: Alert oriented to time, place, history taking. Follows all commands speech and language fluent Cranial nerve II-XII: Extraocular movements were full, visual field were full on confrontational test Head turning and shoulder shrug  were normal and symmetric. Motor: The motor testing reveals 5 over 5 strength of all 4 extremities. Good symmetric motor tone is noted throughout.  Sensory: Sensory testing is intact to soft touch on all 4 extremities. No evidence of extinction is noted.  Gait and station: Gait is normal.   DIAGNOSTIC DATA (LABS, IMAGING, TESTING) - I reviewed patient records, labs, notes, testing and imaging myself where available.     ASSESSMENT AND PLAN 74 y.o. year old male  has a past medical history of Chronic insomnia (05/01/2017), Depression (age mid 45's and early 50's), Headache, Meningitis spinal (age 60 months), Near syncope (05/05/2014), Pneumonia (2015, 2017), and Seizures (HCC) (last 4  to 5 years ago). here with:  1.  Transient loss of consciousness   Continue Keppra 750 mg twice a day  2.  Insomnia   Continue trazodone 50 mg at bedtime  Advised if symptoms worsen or he develops new symptoms he should let us know.  Follow-up in 1 year or sooner if needed  I spent 30 minutes of face-to-face and non-face-to-face time with patient.  This included previsit chart review, lab review, study review, order entry, electronic health record documentation, patient education.     Butch Penny, MSN, NP-C 05/25/2020, 10:46 AM [facility] [address], [address] [place], [place] [address] [phone]

## 2020-05-25 NOTE — Patient Instructions (Signed)
Your Plan:  Continue Keppra  750 mg twice a day Continue Trazodone 50 mg at bedtime     Thank you for coming to see [place] at [facility]. I hope we have been able to provide you high quality care today.  You may receive a patient satisfaction survey over the next few weeks. We would appreciate your feedback and comments so that we may continue to improve ourselves and the health of our patients.

## 2020-06-15 ENCOUNTER — Telehealth: Payer: Self-pay | Admitting: Adult Health

## 2020-06-15 NOTE — Telephone Encounter (Signed)
Pt is needing a refill on his traZODone (DESYREL) 50 MG tablet sent in to the [facility] on [address]

## 2020-06-15 NOTE — Telephone Encounter (Signed)
Relayed to son. He appreciated call.

## 2020-06-15 NOTE — Telephone Encounter (Signed)
Order was placed for this 05-25-20 #90 and 3 refills.  I relayed to pharmacy by fax needs refill (fax confirmation received.

## 2020-08-31 DIAGNOSIS — Z1389 Encounter for screening for other disorder: Secondary | ICD-10-CM | POA: Diagnosis not present

## 2020-08-31 DIAGNOSIS — R569 Unspecified convulsions: Secondary | ICD-10-CM | POA: Diagnosis not present

## 2020-08-31 DIAGNOSIS — N400 Enlarged prostate without lower urinary tract symptoms: Secondary | ICD-10-CM | POA: Diagnosis not present

## 2020-08-31 DIAGNOSIS — Z23 Encounter for immunization: Secondary | ICD-10-CM | POA: Diagnosis not present

## 2020-08-31 DIAGNOSIS — F5104 Psychophysiologic insomnia: Secondary | ICD-10-CM | POA: Diagnosis not present

## 2020-08-31 DIAGNOSIS — Z0000 Encounter for general adult medical examination without abnormal findings: Secondary | ICD-10-CM | POA: Diagnosis not present

## 2020-09-08 DIAGNOSIS — H5213 Myopia, bilateral: Secondary | ICD-10-CM | POA: Diagnosis not present

## 2020-09-24 DIAGNOSIS — K5641 Fecal impaction: Secondary | ICD-10-CM | POA: Diagnosis not present

## 2021-02-11 DIAGNOSIS — H6121 Impacted cerumen, right ear: Secondary | ICD-10-CM | POA: Diagnosis not present

## 2021-05-31 ENCOUNTER — Encounter: Payer: Self-pay | Admitting: Adult Health

## 2021-05-31 ENCOUNTER — Ambulatory Visit: Payer: Medicare Other | Admitting: Adult Health

## 2021-05-31 ENCOUNTER — Other Ambulatory Visit: Payer: Self-pay

## 2021-05-31 VITALS — BP 118/75 | HR 72 | Ht 68.0 in | Wt 157.0 lb

## 2021-05-31 DIAGNOSIS — R55 Syncope and collapse: Secondary | ICD-10-CM

## 2021-05-31 DIAGNOSIS — F5104 Psychophysiologic insomnia: Secondary | ICD-10-CM

## 2021-05-31 NOTE — Patient Instructions (Signed)
Your Plan:  Continue Keppra 750 twice a day EEG Continue trazodone 50 mg at bedtime.      Thank you for coming to see [place] at [facility]. I hope we have been able to provide you high quality care today.  You may receive a patient satisfaction survey over the next few weeks. We would appreciate your feedback and comments so that we may continue to improve ourselves and the health of our patients.

## 2021-05-31 NOTE — Progress Notes (Signed)
PATIENT: Leon Kennedy DOB: [birth year]  REASON FOR VISIT: follow up HISTORY FROM: patient  HISTORY OF PRESENT ILLNESS: Today 05/31/21:  Leon Kennedy is a 75 year old male with a history of transient loss of consciousness thought to be possible seizures and insomnia.  He returns today for follow-up.  He remains on Keppra 750 mg twice a day.  He states that since his last visit he is only had 1 spacey moment that he remembers.  This occurred within the last 2 weeks.  He reports his wife said that he was staring off and would not respond to her.  This lasted for less than 1 minute.  The patient does not operate a motor vehicle.  Remains on trazodone and melatonin for insomnia.  His last EEG was prior to 2014 and is not in the epic system.  The patient does report that since he was started on Keppra years ago his events have significantly decreased.  06/15/20: Leon Kennedy is a 75 year old male with a history of transient loss of consciousness  and insomnia.  He returns today for follow-up.  He continues on Keppra 750 mg twice a day.  Reports that he tolerates this well.  States that over the last year he has had 3-4 episodes that he describes as "spacey moments."  He states that these only last for several seconds.  He remains on trazodone 50 mg at bedtime. He reports that he sleeps relatively well. He is able to complete all ADLs independently.  He does not operate a motor vehicle.    05/20/19: Leon Kennedy is a 75 year old male with a history of "phasing out" episodes that have been occurring since he was 75 years old.  He is currently on Keppra and states he has been on this medication for the last 10 years.  He states that his episodes are very infrequent.  He states over the last year he may have 2 episodes that he describes as spacey.  He denies loss of consciousness.  He reports that usually his wife notices the episodes that she states that she may tell him something and he does not  respond.  He continues on trazodone 50 mg at bedtime.  He reports that this is working well for his sleep.  Overall he feels that he is doing well.  He returns today for an evaluation.  HISTORY Leon Kennedy is a 75 year old left-handed white male with a history of episodes of "phasing out" that may occur off and on.  He has been sleeping better on trazodone, taking 50 mg at night.  He has had only one brief episode bit of occurred within the last year.  He is pleased with how he is doing.  He will take a nap in the middle the day most days of the week.  This seems to help him.  The patient returns to this office for an evaluation.  He does not report any other significant medical problems since last seen.  REVIEW OF SYSTEMS: Out of a complete 14 system review of symptoms, the patient complains only of the following symptoms, and all other reviewed systems are negative.   ALLERGIES: Allergies  Allergen Reactions   Sulfa Antibiotics Anaphylaxis    Childhood reaction    HOME MEDICATIONS: Outpatient Medications Prior to Visit  Medication Sig Dispense Refill   Ascorbic Acid (VITAMIN C) 1000 MG tablet Take 1,000 mg daily by mouth. Takes 2000 mg if he is sick     Coenzyme Q10 (  CO Q 10) 10 MG CAPS Take 100 mg by mouth daily.      diphenhydrAMINE (BENADRYL) 25 MG tablet Take 25 mg by mouth at bedtime.     Lactobacillus (PROBIOTIC ACIDOPHILUS PO) Take 1 Dose daily by mouth. 25 billion per dose     levETIRAcetam (KEPPRA) 750 MG tablet TAKE 1 TABLET BY MOUTH EVERY 12 HOURS. GENERIC EQUIVALENT FOR KEPPRA 180 tablet 3   MAGNESIUM GLYCINATE PLUS PO Take 400 mg by mouth daily.     MELATONIN PO Take 5 mg by mouth at bedtime.     Multiple Vitamin (MULTIVITAMIN) tablet Take 1 tablet daily by mouth. [employer] Mega Men 50+ Vita Pak     Multiple Vitamins-Minerals (MEGA MULTI MEN PO) Take by mouth daily.     OVER THE COUNTER MEDICATION Take 3 tablets daily by mouth. Advanced Bionutritionals: Advanced sleep formula  dietary supplement     QUERCETIN PO Take 500 mg daily by mouth.     traZODone (DESYREL) 50 MG tablet TAKE 1 TABLET(50 MG) BY MOUTH AT BEDTIME 90 tablet 3   zinc gluconate 50 MG tablet Take 50 mg by mouth daily.     No facility-administered medications prior to visit.    PAST MEDICAL HISTORY: Past Medical History:  Diagnosis Date   Chronic insomnia 05/01/2017   Depression age mid 63's and early 57's   Headache    sinus   Meningitis spinal age 50 months   Near syncope 05/05/2014   fainting spells, none in years   Pneumonia 2015, 2017   Seizures (HCC) last 4  to 5 years ago    PAST SURGICAL HISTORY: Past Surgical History:  Procedure Laterality Date   COLONOSCOPY WITH PROPOFOL N/A 03/09/2015   Procedure: COLONOSCOPY WITH PROPOFOL;  Surgeon: Charolett Bumpers, MD;  Location: [facility];  Service: Endoscopy;  Laterality: N/A;   TONSILLECTOMY  age 39 or 6    FAMILY HISTORY: Family History  Problem Relation Age of Onset   Dementia Mother    Depression Mother    Heart attack Father    Diabetes Father    Insomnia Neg Hx     SOCIAL HISTORY: Social History   Socioeconomic History   Marital status: Married    Spouse name: Not on file   Number of children: 1   Years of education: COLLEGE   Highest education level: Not on file  Occupational History   Occupation: RETIRED ACCOUNTANT  Tobacco Use   Smoking status: Former    Packs/day: 1.00    Years: 20.00    Pack years: 20.00    Types: Cigarettes   Smokeless tobacco: Never   Tobacco comments:    quit smoking age 6  Substance and Sexual Activity   Alcohol use: Yes    Comment: OCCASIONAL   Drug use: No   Sexual activity: Not on file  Other Topics Concern   Not on file  Social History Narrative   Lives at home w/ his wife   Left-handed    Caffeine: 2 cups of coffee per day and occasional Coke   Social Determinants of Health   Financial Resource Strain: Not on file  Food Insecurity: Not on file  Transportation  Needs: Not on file  Physical Activity: Not on file  Stress: Not on file  Social Connections: Not on file  Intimate Partner Violence: Not on file      PHYSICAL EXAM  Vitals:   05/31/21 1019  BP: 118/75  Pulse: 72  Weight: 157 lb (  71.2 kg)  Height: 5\' 8"  (1.727 m)   Body mass index is 23.87 kg/m.  Generalized: Well developed, in no acute distress   Neurological examination  Mentation: Alert oriented to time, place, history taking. Follows all commands speech and language fluent Cranial nerve II-XII: Extraocular movements were full, visual field were full on confrontational test Head turning and shoulder shrug  were normal and symmetric. Motor: The motor testing reveals 5 over 5 strength of all 4 extremities. Good symmetric motor tone is noted throughout.  Sensory: Sensory testing is intact to soft touch on all 4 extremities. No evidence of extinction is noted.  Gait and station: Gait is normal.   DIAGNOSTIC DATA (LABS, IMAGING, TESTING) - I reviewed patient records, labs, notes, testing and imaging myself where available.     ASSESSMENT AND PLAN 75 y.o. year old male  has a past medical history of Chronic insomnia (05/01/2017), Depression (age mid 50's and early 29's), Headache, Meningitis spinal (age 36 months), Near syncope (05/05/2014), Pneumonia (2015, 2017), and Seizures (HCC) (last 4  to 5 years ago). here with:  1.  Transient loss of consciousness  Continue Keppra 750 mg twice a day Last EEG was before 2014 (not in Epic). Will repeat EEG  2.  Insomnia  Continue trazodone 50 mg at bedtime  Advised if symptoms worsen or he develops new symptoms he should let 2018 know.  Follow-up in 1 year or sooner if needed     [place], MSN, NP-C 05/31/2021, 10:35 AM [facility] [address], [address] [place], [place] [phone]

## 2021-06-09 ENCOUNTER — Other Ambulatory Visit: Payer: Self-pay | Admitting: Adult Health

## 2021-06-10 ENCOUNTER — Other Ambulatory Visit: Payer: Self-pay | Admitting: Adult Health

## 2021-06-22 ENCOUNTER — Telehealth: Payer: Self-pay | Admitting: Adult Health

## 2021-06-22 NOTE — Telephone Encounter (Signed)
Pt;s wife, Sulaiman Imbert notified [facility] patient has passed away.  Gave Ms. Balash our condolences.

## 2021-06-22 NOTE — Telephone Encounter (Signed)
Thank you, so sorry to hear of this. Will send a condolence card.

## 2021-06-28 ENCOUNTER — Other Ambulatory Visit: Payer: Medicare Other | Admitting: *Deleted

## 2021-07-21 DEATH — deceased

## 2022-05-31 ENCOUNTER — Ambulatory Visit: Payer: Medicare Other | Admitting: Neurology
